# Patient Record
Sex: Female | Born: 1990 | Race: Black or African American | Hispanic: No | Marital: Single | Smoking: Former smoker
Health system: Southern US, Community
[De-identification: ages and names within clinical notes are randomized; demographics above are authoritative.]

## PROBLEM LIST (undated history)

## (undated) ENCOUNTER — Inpatient Hospital Stay (HOSPITAL_COMMUNITY): Payer: Self-pay

## (undated) DIAGNOSIS — A749 Chlamydial infection, unspecified: Secondary | ICD-10-CM

## (undated) DIAGNOSIS — A5901 Trichomonal vulvovaginitis: Secondary | ICD-10-CM

## (undated) DIAGNOSIS — O139 Gestational [pregnancy-induced] hypertension without significant proteinuria, unspecified trimester: Secondary | ICD-10-CM

## (undated) HISTORY — PX: NO PAST SURGERIES: SHX2092

---
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
## 2008-04-24 ENCOUNTER — Inpatient Hospital Stay (HOSPITAL_COMMUNITY): Admission: AD | Admit: 2008-04-24 | Discharge: 2008-04-24 | Payer: Self-pay | Admitting: Obstetrics & Gynecology

## 2008-05-26 ENCOUNTER — Ambulatory Visit (HOSPITAL_COMMUNITY): Admission: RE | Admit: 2008-05-26 | Discharge: 2008-05-26 | Payer: Self-pay | Admitting: Obstetrics

## 2008-07-18 ENCOUNTER — Inpatient Hospital Stay (HOSPITAL_COMMUNITY): Admission: AD | Admit: 2008-07-18 | Discharge: 2008-07-19 | Payer: Self-pay | Admitting: Obstetrics & Gynecology

## 2008-08-26 ENCOUNTER — Encounter: Payer: Self-pay | Admitting: Obstetrics

## 2008-08-26 ENCOUNTER — Inpatient Hospital Stay (HOSPITAL_COMMUNITY): Admission: AD | Admit: 2008-08-26 | Discharge: 2008-08-27 | Payer: Self-pay | Admitting: Obstetrics

## 2008-11-06 ENCOUNTER — Emergency Department (HOSPITAL_COMMUNITY): Admission: EM | Admit: 2008-11-06 | Discharge: 2008-11-06 | Payer: Self-pay | Admitting: Family Medicine

## 2009-01-30 ENCOUNTER — Emergency Department (HOSPITAL_COMMUNITY): Admission: EM | Admit: 2009-01-30 | Discharge: 2009-01-30 | Payer: Self-pay | Admitting: Emergency Medicine

## 2009-04-05 ENCOUNTER — Inpatient Hospital Stay (HOSPITAL_COMMUNITY): Admission: AD | Admit: 2009-04-05 | Discharge: 2009-04-05 | Payer: Self-pay | Admitting: Obstetrics and Gynecology

## 2009-06-01 ENCOUNTER — Ambulatory Visit (HOSPITAL_COMMUNITY): Admission: RE | Admit: 2009-06-01 | Discharge: 2009-06-01 | Payer: Self-pay | Admitting: Obstetrics and Gynecology

## 2009-06-02 ENCOUNTER — Ambulatory Visit: Admission: AD | Admit: 2009-06-02 | Discharge: 2009-06-02 | Payer: Self-pay | Admitting: Obstetrics and Gynecology

## 2009-06-02 ENCOUNTER — Inpatient Hospital Stay (HOSPITAL_COMMUNITY): Admission: AD | Admit: 2009-06-02 | Discharge: 2009-06-02 | Payer: Self-pay | Admitting: Obstetrics and Gynecology

## 2009-06-08 ENCOUNTER — Inpatient Hospital Stay (HOSPITAL_COMMUNITY): Admission: AD | Admit: 2009-06-08 | Discharge: 2009-06-11 | Payer: Self-pay | Admitting: Obstetrics and Gynecology

## 2009-06-11 ENCOUNTER — Encounter: Payer: Self-pay | Admitting: Obstetrics and Gynecology

## 2009-06-14 ENCOUNTER — Ambulatory Visit (HOSPITAL_COMMUNITY): Admission: RE | Admit: 2009-06-14 | Discharge: 2009-06-14 | Payer: Self-pay | Admitting: Obstetrics and Gynecology

## 2009-06-18 ENCOUNTER — Ambulatory Visit (HOSPITAL_COMMUNITY): Admission: RE | Admit: 2009-06-18 | Discharge: 2009-06-18 | Payer: Self-pay | Admitting: Obstetrics and Gynecology

## 2009-06-22 ENCOUNTER — Inpatient Hospital Stay (HOSPITAL_COMMUNITY): Admission: AD | Admit: 2009-06-22 | Discharge: 2009-06-22 | Payer: Self-pay | Admitting: Obstetrics and Gynecology

## 2009-06-23 ENCOUNTER — Ambulatory Visit (HOSPITAL_COMMUNITY): Admission: AD | Admit: 2009-06-23 | Discharge: 2009-06-23 | Payer: Self-pay | Admitting: Obstetrics and Gynecology

## 2009-06-26 ENCOUNTER — Ambulatory Visit (HOSPITAL_COMMUNITY): Admission: RE | Admit: 2009-06-26 | Discharge: 2009-06-26 | Payer: Self-pay | Admitting: Obstetrics and Gynecology

## 2009-06-29 ENCOUNTER — Encounter: Payer: Self-pay | Admitting: Obstetrics and Gynecology

## 2009-06-29 ENCOUNTER — Inpatient Hospital Stay (HOSPITAL_COMMUNITY): Admission: AD | Admit: 2009-06-29 | Discharge: 2009-07-06 | Payer: Self-pay | Admitting: Obstetrics and Gynecology

## 2009-07-02 ENCOUNTER — Encounter: Payer: Self-pay | Admitting: Obstetrics and Gynecology

## 2009-07-03 ENCOUNTER — Ambulatory Visit (HOSPITAL_COMMUNITY): Admission: RE | Admit: 2009-07-03 | Discharge: 2009-07-03 | Payer: Self-pay | Admitting: Obstetrics and Gynecology

## 2009-07-04 ENCOUNTER — Encounter: Payer: Self-pay | Admitting: Obstetrics and Gynecology

## 2009-07-05 ENCOUNTER — Encounter (INDEPENDENT_AMBULATORY_CARE_PROVIDER_SITE_OTHER): Payer: Self-pay | Admitting: Obstetrics and Gynecology

## 2009-08-03 ENCOUNTER — Encounter: Admission: RE | Admit: 2009-08-03 | Discharge: 2009-08-03 | Payer: Self-pay | Admitting: Obstetrics and Gynecology

## 2010-02-17 ENCOUNTER — Encounter: Payer: Self-pay | Admitting: Obstetrics and Gynecology

## 2010-02-17 ENCOUNTER — Encounter: Payer: Self-pay | Admitting: Family Medicine

## 2010-04-14 LAB — POCT URINALYSIS DIP (DEVICE)
Bilirubin Urine: NEGATIVE
Glucose, UA: NEGATIVE mg/dL
Hgb urine dipstick: NEGATIVE
Ketones, ur: NEGATIVE mg/dL
Protein, ur: NEGATIVE mg/dL
Urobilinogen, UA: 0.2 mg/dL (ref 0.0–1.0)

## 2010-04-14 LAB — GC/CHLAMYDIA PROBE AMP, GENITAL: Chlamydia, DNA Probe: NEGATIVE

## 2010-04-14 LAB — POCT PREGNANCY, URINE: Preg Test, Ur: POSITIVE

## 2010-04-15 LAB — CBC
HCT: 27.6 % — ABNORMAL LOW (ref 36.0–46.0)
HCT: 27.9 % — ABNORMAL LOW (ref 36.0–46.0)
HCT: 29.6 % — ABNORMAL LOW (ref 36.0–46.0)
HCT: 29.7 % — ABNORMAL LOW (ref 36.0–46.0)
Hemoglobin: 8.7 g/dL — ABNORMAL LOW (ref 12.0–15.0)
Hemoglobin: 9.2 g/dL — ABNORMAL LOW (ref 12.0–15.0)
Hemoglobin: 9.4 g/dL — ABNORMAL LOW (ref 12.0–15.0)
MCHC: 31.1 g/dL (ref 30.0–36.0)
MCHC: 31.5 g/dL (ref 30.0–36.0)
MCV: 66 fL — ABNORMAL LOW (ref 78.0–100.0)
MCV: 66.8 fL — ABNORMAL LOW (ref 78.0–100.0)
Platelets: 281 10*3/uL (ref 150–400)
Platelets: 298 10*3/uL (ref 150–400)
Platelets: 314 10*3/uL (ref 150–400)
RBC: 4.27 MIL/uL (ref 3.87–5.11)
RBC: 4.43 MIL/uL (ref 3.87–5.11)
RBC: 4.53 MIL/uL (ref 3.87–5.11)
WBC: 10.4 10*3/uL (ref 4.0–10.5)
WBC: 10.7 10*3/uL — ABNORMAL HIGH (ref 4.0–10.5)
WBC: 9.6 10*3/uL (ref 4.0–10.5)

## 2010-04-15 LAB — TYPE AND SCREEN
ABO/RH(D): O POS
Antibody Screen: POSITIVE
DAT, IgG: NEGATIVE
PT AG Type: NEGATIVE

## 2010-04-15 LAB — SYPHILIS: RPR W/REFLEX TO RPR TITER AND TREPONEMAL ANTIBODIES, TRADITIONAL SCREENING AND DIAGNOSIS ALGORITHM: RPR Ser Ql: NONREACTIVE

## 2010-04-15 LAB — RPR: RPR Ser Ql: NONREACTIVE

## 2010-04-16 LAB — URINALYSIS, ROUTINE W REFLEX MICROSCOPIC
Nitrite: NEGATIVE
Specific Gravity, Urine: 1.01 (ref 1.005–1.030)
pH: 7 (ref 5.0–8.0)

## 2010-04-16 LAB — CBC
Hemoglobin: 7.6 g/dL — ABNORMAL LOW (ref 12.0–15.0)
RBC: 3.82 MIL/uL — ABNORMAL LOW (ref 3.87–5.11)
RDW: 24.4 % — ABNORMAL HIGH (ref 11.5–15.5)

## 2010-04-21 LAB — URINALYSIS, ROUTINE W REFLEX MICROSCOPIC
Hgb urine dipstick: NEGATIVE
Specific Gravity, Urine: 1.025 (ref 1.005–1.030)
Urobilinogen, UA: 0.2 mg/dL (ref 0.0–1.0)

## 2010-05-02 LAB — WET PREP, GENITAL: Clue Cells Wet Prep HPF POC: NONE SEEN

## 2010-05-02 LAB — POCT URINALYSIS DIP (DEVICE)
Bilirubin Urine: NEGATIVE
Glucose, UA: NEGATIVE mg/dL
Nitrite: NEGATIVE

## 2010-05-02 LAB — POCT PREGNANCY, URINE: Preg Test, Ur: NEGATIVE

## 2010-05-02 LAB — GC/CHLAMYDIA PROBE AMP, GENITAL
Chlamydia, DNA Probe: NEGATIVE
GC Probe Amp, Genital: NEGATIVE

## 2010-05-04 LAB — CBC
HCT: 18.2 % — ABNORMAL LOW (ref 36.0–46.0)
Hemoglobin: 6.1 g/dL — CL (ref 12.0–15.0)
MCHC: 33.2 g/dL (ref 30.0–36.0)
MCV: 76.9 fL — ABNORMAL LOW (ref 78.0–100.0)
RDW: 17.5 % — ABNORMAL HIGH (ref 11.5–15.5)

## 2010-05-05 LAB — CBC
MCHC: 32.4 g/dL (ref 30.0–36.0)
MCV: 76.7 fL — ABNORMAL LOW (ref 78.0–100.0)
Platelets: 239 10*3/uL (ref 150–400)
RDW: 16.9 % — ABNORMAL HIGH (ref 11.5–15.5)

## 2010-05-05 LAB — PROTIME-INR: Prothrombin Time: 12.4 seconds (ref 11.6–15.2)

## 2010-05-05 LAB — FIBRINOGEN: Fibrinogen: 345 mg/dL (ref 204–475)

## 2010-05-06 LAB — URINE CULTURE

## 2010-05-06 LAB — CBC
Platelets: 260 10*3/uL (ref 150–400)
RDW: 15.1 % (ref 11.5–15.5)
WBC: 7.4 10*3/uL (ref 4.0–10.5)

## 2010-05-06 LAB — RPR: RPR Ser Ql: NONREACTIVE

## 2010-05-09 LAB — WET PREP, GENITAL
Trich, Wet Prep: NONE SEEN
Yeast Wet Prep HPF POC: NONE SEEN

## 2010-05-09 LAB — URINALYSIS, ROUTINE W REFLEX MICROSCOPIC
Bilirubin Urine: NEGATIVE
Hgb urine dipstick: NEGATIVE
Nitrite: NEGATIVE
Protein, ur: NEGATIVE mg/dL
Specific Gravity, Urine: 1.015 (ref 1.005–1.030)
Urobilinogen, UA: 0.2 mg/dL (ref 0.0–1.0)

## 2010-05-09 LAB — GC/CHLAMYDIA PROBE AMP, GENITAL
Chlamydia, DNA Probe: NEGATIVE
GC Probe Amp, Genital: NEGATIVE

## 2010-05-15 ENCOUNTER — Emergency Department (HOSPITAL_COMMUNITY)
Admission: EM | Admit: 2010-05-15 | Discharge: 2010-05-15 | Disposition: A | Discharge status: Home / Self Care | Payer: Self-pay | Attending: Emergency Medicine | Admitting: Emergency Medicine

## 2010-05-15 DIAGNOSIS — L298 Other pruritus: Secondary | ICD-10-CM | POA: Insufficient documentation

## 2010-05-15 DIAGNOSIS — R21 Rash and other nonspecific skin eruption: Secondary | ICD-10-CM | POA: Insufficient documentation

## 2010-05-15 DIAGNOSIS — L2989 Other pruritus: Secondary | ICD-10-CM | POA: Insufficient documentation

## 2010-06-11 NOTE — H&P (Signed)
NAME:  Kendra Fields, Kendra Fields              ACCOUNT NO.:  [id number]   MEDICAL RECORD NO.:  [id number]          PATIENT TYPE:  INP   LOCATION:  9161                          FACILITY:  [facility]   PHYSICIAN:  Roseanna Rainbow, M.D.DATE OF BIRTH:  11-Feb-1990   DATE OF ADMISSION:  10/27/2007  DATE OF DISCHARGE:                              HISTORY & PHYSICAL   CHIEF COMPLAINT:  The patient is a 20 year old para 0 with a fetal  demise at 32 weeks and elevated blood pressures.   HISTORY OF PRESENT ILLNESS:  The patient presented to the office earlier  today and was found to have blood pressures in the 140s/110 range and 3+  proteinuria on urine dip.  The fetal heart rate was unable to be  obtained with the Doppler.  An ultrasound in the Radiology Suite at  [facility] subsequently confirmed a fetal demise at 32 plus weeks.  The patient denies any neurological symptoms at present.   SOCIAL HISTORY:  She is single.  She denies any tobacco, ethanol, or  drug use.   PAST GYN HISTORY:  There is a history of chlamydia.   PAST SURGICAL HISTORY:  She denies.   FAMILY HISTORY:  Noncontributory.   PHYSICAL EXAMINATION:  VITAL SIGNS:  Blood pressures initially  180s/110s.  After 20 mg IV labetalol push, blood pressure is 140s/90s.  GENERAL:  Thin African American female, flat, affect appropriate.  PELVIC:  Sterile vaginal exam of the cervix is 1 cm dilated, thick.  The  membranes were palpable.  An attempt was made to rupture; however, this  was unsuccessful.   LABORATORY VALUES:  PIH panel essentially normal, normal platelets, LDH,  creatinine, and LFTs.   ASSESSMENT:  Fetal demise at 32 plus weeks likely in the setting of  pregnancy-induced hypertension.  Labile blood pressures.   PLAN:  Admission, magnesium sulfate for seizure prophylaxis with strict  IV fluids.  Labetalol parenterally in labor as needed, epidural now,  anxiolytics as a standing order until delivery, and we will also  obtain  a DIC panel.      Roseanna Rainbow, M.D.  Electronically Signed     LAJ/MEDQ  D:  10/27/2007  T:  10/28/2007  Job:  [id number]

## 2010-10-21 LAB — POCT PREGNANCY, URINE: Preg Test, Ur: POSITIVE

## 2010-10-28 LAB — URINE MICROSCOPIC-ADD ON

## 2010-10-28 LAB — URINALYSIS, ROUTINE W REFLEX MICROSCOPIC
Glucose, UA: NEGATIVE
Leukocytes, UA: NEGATIVE
Nitrite: NEGATIVE
Protein, ur: 100 — AB
pH: 7

## 2010-10-28 LAB — COMPREHENSIVE METABOLIC PANEL
ALT: 11
AST: 20
Calcium: 9
GFR calc Af Amer: >60
Sodium: 135
Total Protein: 6.6

## 2010-10-28 LAB — KLEIHAUER-BETKE STAIN
Fetal Cells %: 0
Quantitation Fetal Hemoglobin: 0

## 2010-10-28 LAB — DIC (DISSEMINATED INTRAVASCULAR COAGULATION)PANEL
D-Dimer, Quant: 2.58 — ABNORMAL HIGH
Fibrinogen: 490 — ABNORMAL HIGH
Smear Review: NONE SEEN

## 2010-10-28 LAB — CBC
MCHC: 32
Platelets: 306
RDW: 15.3

## 2010-10-29 LAB — RAPID URINE DRUG SCREEN, HOSP PERFORMED: Cocaine: NOT DETECTED

## 2010-10-30 ENCOUNTER — Inpatient Hospital Stay (HOSPITAL_COMMUNITY)
Admission: AD | Admit: 2010-10-30 | Discharge: 2010-10-30 | Disposition: A | Discharge status: Home / Self Care | Payer: Self-pay | Source: Ambulatory Visit | Attending: Obstetrics & Gynecology | Admitting: Obstetrics & Gynecology

## 2010-10-30 ENCOUNTER — Inpatient Hospital Stay (HOSPITAL_COMMUNITY): Payer: Self-pay

## 2010-10-30 ENCOUNTER — Encounter (HOSPITAL_COMMUNITY): Payer: Self-pay

## 2010-10-30 DIAGNOSIS — R102 Pelvic and perineal pain: Secondary | ICD-10-CM

## 2010-10-30 DIAGNOSIS — R1032 Left lower quadrant pain: Secondary | ICD-10-CM

## 2010-10-30 DIAGNOSIS — N949 Unspecified condition associated with female genital organs and menstrual cycle: Secondary | ICD-10-CM

## 2010-10-30 HISTORY — DX: Gestational (pregnancy-induced) hypertension without significant proteinuria, unspecified trimester: O13.9

## 2010-10-30 LAB — DIFFERENTIAL
Eosinophils Relative: 1 % (ref 0–5)
Lymphocytes Relative: 57 % — ABNORMAL HIGH (ref 12–46)
Lymphs Abs: 2.3 10*3/uL (ref 0.7–4.0)
Monocytes Absolute: 0.3 10*3/uL (ref 0.1–1.0)
Neutro Abs: 1.4 10*3/uL — ABNORMAL LOW (ref 1.7–7.7)

## 2010-10-30 LAB — POCT PREGNANCY, URINE: Preg Test, Ur: NEGATIVE

## 2010-10-30 LAB — CBC
HCT: 35.6 % — ABNORMAL LOW (ref 36.0–46.0)
Hemoglobin: 11.7 g/dL — ABNORMAL LOW (ref 12.0–15.0)
MCV: 80.4 fL (ref 78.0–100.0)
RBC: 4.43 MIL/uL (ref 3.87–5.11)
WBC: 4.1 10*3/uL (ref 4.0–10.5)

## 2010-10-30 LAB — URINALYSIS, ROUTINE W REFLEX MICROSCOPIC
Glucose, UA: NEGATIVE mg/dL
Ketones, ur: NEGATIVE mg/dL
Leukocytes, UA: NEGATIVE
Specific Gravity, Urine: 1.01 (ref 1.005–1.030)
pH: 7 (ref 5.0–8.0)

## 2010-10-30 LAB — URINE MICROSCOPIC-ADD ON

## 2010-10-30 LAB — WET PREP, GENITAL

## 2010-10-30 MED ORDER — KETOROLAC TROMETHAMINE 60 MG/2ML IM SOLN
60.0000 mg | Freq: Once | INTRAMUSCULAR | Status: AC
  Administered 2010-10-30: 60 mg via INTRAMUSCULAR
  Filled 2010-10-30: qty 2

## 2010-10-30 MED ORDER — NAPROXEN SODIUM 550 MG PO TABS: 550.0000 mg | ORAL_TABLET | Freq: Two times a day (BID) | ORAL | Status: DC

## 2010-10-30 NOTE — ED Provider Notes (Signed)
History     CSN: [id number] Arrival date & time: 10/30/2010 10:25 AM   HPI Kendra Fields is a 20 y.o. female who presents to [facility] for low abdominal pain that started a week ago. The pain is more on the left side. Some constipation, no n/v. Marina for birth control, no history of STD's. Current sex partner x 4 years. Last pap smear 1 year ago and was normal.   Past Medical History  Diagnosis Date  . Preterm labor   . Pregnancy induced hypertension     Past Surgical History  Procedure Date  . No past surgeries     Family History  Problem Relation Age of Onset  . Alcohol abuse Mother   . Asthma Brother   . Stroke Maternal Uncle   . Cancer Maternal Grandmother     brain  . Diabetes Maternal Grandmother   . Diabetes Paternal Grandmother   . Hypertension Paternal Grandmother     History  Substance Use Topics  . Smoking status: Never Smoker   . Smokeless tobacco: Not on file  . Alcohol Use: Yes     occassionally    OB History    Grav Para Term Preterm Abortions TAB SAB Ect Mult Living   3 3 0 3  0 0 0 1 2      Review of Systems  Constitutional: Negative for fever, chills, diaphoresis and fatigue.  HENT: Negative for ear pain, congestion, sore throat, facial swelling, neck pain, neck stiffness, dental problem and sinus pressure.   Eyes: Negative for photophobia, pain and discharge.  Respiratory: Negative for cough, chest tightness and wheezing.   Cardiovascular: Negative.   Gastrointestinal: Positive for abdominal pain and constipation. Negative for nausea, vomiting and diarrhea.  Genitourinary: Positive for frequency. Negative for dysuria, flank pain, decreased urine volume, vaginal bleeding, vaginal discharge and difficulty urinating.  Musculoskeletal: Negative for myalgias, back pain and gait problem.  Skin: Negative for color change and rash.  Neurological: Positive for headaches. Negative for dizziness, speech difficulty, weakness, light-headedness and numbness.    Psychiatric/Behavioral: Negative for confusion and agitation. The patient is not nervous/anxious.     Allergies  Penicillins  Home Medications  No current outpatient prescriptions on file.  BP 123/83  Pulse 78  Temp(Src) 98.6 F (37 C) (Oral)  Resp 20  Ht 5\' 4"  (1.626 m)  Wt 133 lb 4 oz (60.442 kg)  BMI 22.87 kg/m2  Physical Exam  Nursing note and vitals reviewed. Constitutional: She is oriented to person, place, and time. She appears well-developed and well-nourished. No distress.  HENT:  Head: Normocephalic.  Eyes: EOM are normal.  Neck: Neck supple.  Pulmonary/Chest: Effort normal.  Abdominal: Soft. There is tenderness in the left lower quadrant. There is no rebound and no guarding.  Genitourinary:       External genitalia pink, moist, without lesions. Cervix, IUD string is visible, small amount of dark blood vaginal vault. No adnexal mass palpable, uterus not enlarged. Mildly tender left adnexa. Rectal exam - normal tone, no stool palpable.  Musculoskeletal: Normal range of motion.  Neurological: She is alert and oriented to person, place, and time. No cranial nerve deficit.  Skin: Skin is warm and dry.  US Transvaginal Non-ob  10/30/2010  *RADIOLOGY REPORT*  Clinical Data: Left-sided pelvic pain.  IUD.  TRANSABDOMINAL AND TRANSVAGINAL ULTRASOUND OF PELVIS  Technique:  Both transabdominal and transvaginal ultrasound examinations of the pelvis were performed.  Transabdominal technique was performed for global imaging of the pelvis  including uterus, ovaries, adnexal regions, and pelvic cul-de-sac.  It was necessary to proceed with endovaginal exam following the transabdominal exam to visualize the IUD location and ovaries.  Comparison:  None.  Findings: Uterus:  Normal in size and appearance  Endometrium: IUD is seen in normal position in the endometrial cavity, which is confirmed by 3-D volume imaging.  Double layer thickness measures 3 mm.  Right ovary: Normal appearance/no  adnexal mass  Left ovary: Normal appearance/no adnexal mass  Other Findings:  Tiny amount of free fluid in the pelvic cul-de-sac is likely physiologic.  IMPRESSION:  1.  Normal uterus, with normal position of IUD in endometrial cavity. 2.  Normal ovaries.  No adnexal mass identified. 3.  Small amount of free fluid in the cul-de-sac, which is nonspecific but likely physiologic.  Original Report Authenticated By: Danae Orleans, M.D.   US Pelvis Complete  10/30/2010  *RADIOLOGY REPORT*  Clinical Data: Left-sided pelvic pain.  IUD.  TRANSABDOMINAL AND TRANSVAGINAL ULTRASOUND OF PELVIS  Technique:  Both transabdominal and transvaginal ultrasound examinations of the pelvis were performed.  Transabdominal technique was performed for global imaging of the pelvis including uterus, ovaries, adnexal regions, and pelvic cul-de-sac.  It was necessary to proceed with endovaginal exam following the transabdominal exam to visualize the IUD location and ovaries.  Comparison:  None.  Findings: Uterus:  Normal in size and appearance  Endometrium: IUD is seen in normal position in the endometrial cavity, which is confirmed by 3-D volume imaging.  Double layer thickness measures 3 mm.  Right ovary: Normal appearance/no adnexal mass  Left ovary: Normal appearance/no adnexal mass  Other Findings:  Tiny amount of free fluid in the pelvic cul-de-sac is likely physiologic.  IMPRESSION:  1.  Normal uterus, with normal position of IUD in endometrial cavity. 2.  Normal ovaries.  No adnexal mass identified. 3.  Small amount of free fluid in the cul-de-sac, which is nonspecific but likely physiologic.  Original Report Authenticated By: Danae Orleans, M.D.   Results for orders placed during the hospital encounter of 10/30/10 (from the past 24 hour(s))  URINALYSIS, ROUTINE W REFLEX MICROSCOPIC     Status: Abnormal   Collection Time   10/30/10 11:10 AM      Component Value Range   Color, Urine YELLOW  YELLOW    Appearance CLEAR  CLEAR     Specific Gravity, Urine 1.010  1.005 - 1.030    pH 7.0  5.0 - 8.0    Glucose, UA NEGATIVE  NEGATIVE (mg/dL)   Hgb urine dipstick TRACE (*) NEGATIVE    Bilirubin Urine NEGATIVE  NEGATIVE    Ketones, ur NEGATIVE  NEGATIVE (mg/dL)   Protein, ur NEGATIVE  NEGATIVE (mg/dL)   Urobilinogen, UA 0.2  0.0 - 1.0 (mg/dL)   Nitrite NEGATIVE  NEGATIVE    Leukocytes, UA NEGATIVE  NEGATIVE   URINE MICROSCOPIC-ADD ON     Status: Normal   Collection Time   10/30/10 11:10 AM      Component Value Range   Squamous Epithelial / LPF RARE  RARE    WBC, UA 0-2  <3 (WBC/hpf)   RBC / HPF 0-2  <3 (RBC/hpf)  POCT PREGNANCY, URINE     Status: Normal   Collection Time   10/30/10 11:19 AM      Component Value Range   Preg Test, Ur NEGATIVE    WET PREP, GENITAL     Status: Abnormal   Collection Time   10/30/10 11:51 AM  Component Value Range   Yeast, Wet Prep NONE SEEN  NONE SEEN    Trich, Wet Prep NONE SEEN  NONE SEEN    Clue Cells, Wet Prep FEW (*) NONE SEEN    WBC, Wet Prep HPF POC FEW (*) NONE SEEN   CBC     Status: Abnormal   Collection Time   10/30/10 11:58 AM      Component Value Range   WBC 4.1  4.0 - 10.5 (K/uL)   RBC 4.43  3.87 - 5.11 (MIL/uL)   Hemoglobin 11.7 (*) 12.0 - 15.0 (g/dL)   HCT 16.1 (*) 09.6 - 46.0 (%)   MCV 80.4  78.0 - 100.0 (fL)   MCH 26.4  26.0 - 34.0 (pg)   MCHC 32.9  30.0 - 36.0 (g/dL)   RDW 04.5  40.9 - 81.1 (%)   Platelets 264  150 - 400 (K/uL)  DIFFERENTIAL     Status: Abnormal   Collection Time   10/30/10 11:58 AM      Component Value Range   Neutrophils Relative 35 (*) 43 - 77 (%)   Neutro Abs 1.4 (*) 1.7 - 7.7 (K/uL)   Lymphocytes Relative 57 (*) 12 - 46 (%)   Lymphs Abs 2.3  0.7 - 4.0 (K/uL)   Monocytes Relative 7  3 - 12 (%)   Monocytes Absolute 0.3  0.1 - 1.0 (K/uL)   Eosinophils Relative 1  0 - 5 (%)   Eosinophils Absolute 0.1  0.0 - 0.7 (K/uL)   Basophils Relative 1  0 - 1 (%)   Basophils Absolute 0.0  0.0 - 0.1 (K/uL)   Assessment: Pelvic  pain  Plan:  Discussed results of lab and ultrasound with patient.   Toradol 60 mg. IM x 1   Rx:Anaprox DS   Follow up in [facility]  ED Course  Procedures            [facility], NP 10/30/10 1352

## 2010-10-30 NOTE — Progress Notes (Signed)
Pt states, " I've had low abdominal pain for about a week, but it has been worse since last night, and mostly on my left side."

## 2011-04-19 ENCOUNTER — Emergency Department (INDEPENDENT_AMBULATORY_CARE_PROVIDER_SITE_OTHER)
Admission: EM | Admit: 2011-04-19 | Discharge: 2011-04-19 | Disposition: A | Discharge status: Home / Self Care | Payer: Self-pay | Source: Home / Self Care | Attending: Emergency Medicine | Admitting: Emergency Medicine

## 2011-04-19 ENCOUNTER — Encounter (HOSPITAL_COMMUNITY): Payer: Self-pay

## 2011-04-19 DIAGNOSIS — Z789 Other specified health status: Secondary | ICD-10-CM

## 2011-04-19 DIAGNOSIS — IMO0001 Reserved for inherently not codable concepts without codable children: Secondary | ICD-10-CM

## 2011-04-19 DIAGNOSIS — N760 Acute vaginitis: Secondary | ICD-10-CM

## 2011-04-19 LAB — POCT PREGNANCY, URINE: Preg Test, Ur: NEGATIVE

## 2011-04-19 LAB — POCT URINALYSIS DIP (DEVICE)
Ketones, ur: NEGATIVE mg/dL
Leukocytes, UA: NEGATIVE
Nitrite: NEGATIVE
Protein, ur: NEGATIVE mg/dL
Urobilinogen, UA: 1 mg/dL (ref 0.0–1.0)
pH: 5.5 (ref 5.0–8.0)

## 2011-04-19 LAB — WET PREP, GENITAL

## 2011-04-19 MED ORDER — FLUCONAZOLE 200 MG PO TABS: 200.0000 mg | ORAL_TABLET | Freq: Every day | ORAL | Status: DC

## 2011-04-19 NOTE — Discharge Instructions (Signed)
We will contact you ONLY if further treatment necessary as results become available

## 2011-04-19 NOTE — ED Provider Notes (Signed)
History     CSN: [id number]  Arrival date & time 04/19/11  1144   First MD Initiated Contact with Patient 04/19/11 1204      Chief Complaint  Patient presents with  . Vaginitis    (Consider location/radiation/quality/duration/timing/severity/associated sxs/prior treatment) HPI Comments: Patient presents urgent care complaining about a week ago has been experiencing burning, itching and discharge. She did try for 1 day Monistat but that seemed not to help her with her symptoms. She had started with her expected. Yesterday and today it's more abundant.  Patient denies any pelvic pain, does not seem to be concerned about STD says it has same partner for 5 years. Denies any fevers, or gastrointestinal symptoms  Patient is a 21 y.o. female presenting with vaginal discharge. The history is provided by the patient.  Vaginal Discharge This is a new problem. The current episode started more than 1 week ago. The problem occurs constantly. The problem has not changed since onset.Pertinent negatives include no abdominal pain. Treatments tried: OTC CREAM?    Past Medical History  Diagnosis Date  . Preterm labor   . Pregnancy induced hypertension     Past Surgical History  Procedure Date  . No past surgeries     Family History  Problem Relation Age of Onset  . Alcohol abuse Mother   . Asthma Brother   . Stroke Maternal Uncle   . Cancer Maternal Grandmother     brain  . Diabetes Maternal Grandmother   . Diabetes Paternal Grandmother   . Hypertension Paternal Grandmother     History  Substance Use Topics  . Smoking status: Never Smoker   . Smokeless tobacco: Not on file  . Alcohol Use: Yes     occassionally    OB History    Grav Para Term Preterm Abortions TAB SAB Ect Mult Living   3 3 0 3  0 0 0 1 2      Review of Systems  Constitutional: Negative for fever, chills and fatigue.  Gastrointestinal: Negative for abdominal pain.  Genitourinary: Positive for vaginal  bleeding and vaginal discharge. Negative for dysuria, frequency and flank pain.    Allergies  Penicillins  Home Medications   Current Outpatient Rx  Name Route Sig Dispense Refill  . ACETAMINOPHEN 500 MG PO TABS Oral Take 1,000 mg by mouth as needed. For pain      . FLUCONAZOLE 200 MG PO TABS Oral Take 1 tablet (200 mg total) by mouth daily. 1 tablet 0  . LEVONORGESTREL 20 MCG/24HR IU IUD Intrauterine 1 each by Intrauterine route once. Pt states that she had her Mirena placed 10/2009     . NAPROXEN SODIUM 550 MG PO TABS Oral Take 1 tablet (550 mg total) by mouth 2 (two) times daily with a meal. 15 tablet 0    BP 111/58  Pulse 67  Temp(Src) 98.8 F (37.1 C) (Oral)  Resp 18  SpO2 100%  Physical Exam  Nursing note and vitals reviewed. Constitutional: She appears well-developed and well-nourished.  Eyes: Conjunctivae are normal.  Abdominal: Soft. There is no tenderness. There is no rebound and no guarding.  Genitourinary: Guaiac negative stool. There is no rash, tenderness or lesion on the right labia. There is no rash, tenderness or lesion on the left labia. There is bleeding around the vagina. No foreign body around the vagina. No signs of injury around the vagina. No vaginal discharge found.    ED Course  Procedures (including critical care time)  Labs  Reviewed  POCT URINALYSIS DIP (DEVICE) - Abnormal; Notable for the following:    Hgb urine dipstick LARGE (*)    All other components within normal limits  POCT PREGNANCY, URINE  WET PREP, GENITAL  GC/CHLAMYDIA PROBE AMP, GENITAL   No results found.   1. Vaginitis   2. Onset of menses       MDM  Limited exam is unable visualize character of discharge as patient was actively with menses, samples obtained for wet prep and STD screening        Jimmie Molly, MD 04/19/11 1252

## 2011-04-19 NOTE — ED Notes (Signed)
Patient states that symptoms started a week ago. Itching, burning, discharge (white cheesy). Tried one day Monistat when symptoms first started a week ago. Patient states that it started to get better but symptoms did not go away.

## 2011-04-20 ENCOUNTER — Encounter (HOSPITAL_COMMUNITY): Payer: Self-pay

## 2011-04-20 ENCOUNTER — Emergency Department (HOSPITAL_COMMUNITY)
Admission: EM | Admit: 2011-04-20 | Discharge: 2011-04-20 | Disposition: A | Discharge status: Home / Self Care | Payer: Self-pay | Attending: Emergency Medicine | Admitting: Emergency Medicine

## 2011-04-20 DIAGNOSIS — H109 Unspecified conjunctivitis: Secondary | ICD-10-CM | POA: Insufficient documentation

## 2011-04-20 DIAGNOSIS — Z880 Allergy status to penicillin: Secondary | ICD-10-CM | POA: Insufficient documentation

## 2011-04-20 MED ORDER — POLYMYXIN B-TRIMETHOPRIM 10000-0.1 UNIT/ML-% OP SOLN: OPHTHALMIC | Status: DC

## 2011-04-20 NOTE — Discharge Instructions (Signed)
Conjunctivitis Conjunctivitis is commonly called "pink eye." Conjunctivitis can be caused by bacterial or viral infection, allergies, or injuries. There is usually redness of the lining of the eye, itching, discomfort, and sometimes discharge. There may be deposits of matter along the eyelids. A viral infection usually causes a watery discharge, while a bacterial infection causes a yellowish, thick discharge. Pink eye is very contagious and spreads by direct contact. You may be given antibiotic eyedrops as part of your treatment. Before using your eye medicine, remove all drainage from the eye by washing gently with warm water and cotton balls. Continue to use the medication until you have awakened 2 mornings in a row without discharge from the eye. Do not rub your eye. This increases the irritation and helps spread infection. Use separate towels from other household members. Wash your hands with soap and water before and after touching your eyes. Use cold compresses to reduce pain and sunglasses to relieve irritation from light. Do not wear contact lenses or wear eye makeup until the infection is gone. SEEK MEDICAL CARE IF:   Your symptoms are not better after 3 days of treatment.   You have increased pain or trouble seeing.   The outer eyelids become very red or swollen.  Document Released: 02/21/2004 Document Revised: 01/02/2011 Document Reviewed: 01/13/2005 [employer] Patient Information 2012 [employer]. 

## 2011-04-20 NOTE — ED Provider Notes (Signed)
History     CSN: [id number]  Arrival date & time 04/20/11  1454   First MD Initiated Contact with Patient 04/20/11 1516      Chief Complaint  Patient presents with  . Conjunctivitis    (Consider location/radiation/quality/duration/timing/severity/associated sxs/prior treatment) HPI Comments: 21 year old female who presents for right eye redness. Mother reports sensitivity to light, and some drainage noted. No change in vision. Normal extraocular movements. No pain with eye movement. Mild rhinorrhea, no fevers. Minimal URI symptoms.    Patient is a 21 y.o. female presenting with conjunctivitis. The history is provided by the patient. No language interpreter was used.  Conjunctivitis  The current episode started today. The onset was sudden. The problem occurs continuously. The problem has been unchanged. The problem is mild. The symptoms are relieved by nothing. The symptoms are aggravated by light. Associated symptoms include photophobia and eye redness. Pertinent negatives include no decreased vision, no double vision, no eye itching, no ear discharge, no ear pain, no headaches, no mouth sores, no stridor and no eye pain.    Past Medical History  Diagnosis Date  . Preterm labor   . Pregnancy induced hypertension     Past Surgical History  Procedure Date  . No past surgeries     Family History  Problem Relation Age of Onset  . Alcohol abuse Mother   . Asthma Brother   . Stroke Maternal Uncle   . Cancer Maternal Grandmother     brain  . Diabetes Maternal Grandmother   . Diabetes Paternal Grandmother   . Hypertension Paternal Grandmother     History  Substance Use Topics  . Smoking status: Never Smoker   . Smokeless tobacco: Not on file  . Alcohol Use: Yes     occassionally    OB History    Grav Para Term Preterm Abortions TAB SAB Ect Mult Living   3 3 0 3  0 0 0 1 2      Review of Systems  HENT: Negative for ear pain, mouth sores and ear discharge.   Eyes:  Positive for photophobia and redness. Negative for double vision, pain and itching.  Respiratory: Negative for stridor.   Neurological: Negative for headaches.  All other systems reviewed and are negative.    Allergies  Penicillins  Home Medications   Current Outpatient Rx  Name Route Sig Dispense Refill  . LEVONORGESTREL 20 MCG/24HR IU IUD Intrauterine 1 each by Intrauterine route once. Pt states that she had her Mirena placed 10/2009     . POLYMYXIN B-TRIMETHOPRIM 10000-0.1 UNIT/ML-% OP SOLN  1 drop to right eye q 4 hours while awake x 7 days 10 mL 0    BP 126/79  Pulse 101  Temp(Src) 98.1 F (36.7 C) (Oral)  Resp 18  Wt 137 lb (62.143 kg)  SpO2 100%  Physical Exam  Vitals reviewed. Constitutional: She appears well-developed and well-nourished.  HENT:  Right Ear: External ear normal.  Left Ear: External ear normal.  Eyes: EOM are normal. Pupils are equal, round, and reactive to light. Right eye exhibits discharge.       Right eye with scleral injection and drainage.  Neck: Normal range of motion. Neck supple.  Cardiovascular: Normal rate, normal heart sounds and intact distal pulses.   Pulmonary/Chest: Effort normal.  Abdominal: Soft. Bowel sounds are normal.  Musculoskeletal: Normal range of motion.  Neurological: She is alert.  Skin: Skin is warm and dry.    ED Course  Procedures (including critical  care time)  Labs Reviewed - No data to display No results found.   1. Conjunctivitis       MDM  Patient is a 21 year old with conjunctivitis. We'll start on Polytrim drops. Discussed signs to warrant reevaluation.        Chrystine Oiler, MD 04/20/11 [phone]

## 2011-04-20 NOTE — ED Notes (Signed)
?   Pink eye onset yesterday.  Pt reports pain and drainage.

## 2011-04-21 LAB — GC/CHLAMYDIA PROBE AMP, GENITAL: Chlamydia, DNA Probe: NEGATIVE

## 2011-10-10 IMAGING — US US FETAL BPP W/O NONSTRESS
1 series · 18 of 28 positions shown · non-contrast
Comparison: none

OBSTETRICAL ULTRASOUND:
 This ultrasound was performed in The [HOSPITAL], and the [facility] report will be stored to [REDACTED] PACS.  This report is also available in [HOSPITAL]?s accessANYware.

[Series 1: us fetal bpp w/o nonstress · 37 acquisitions, 18 frames shown]
[im 1/37]
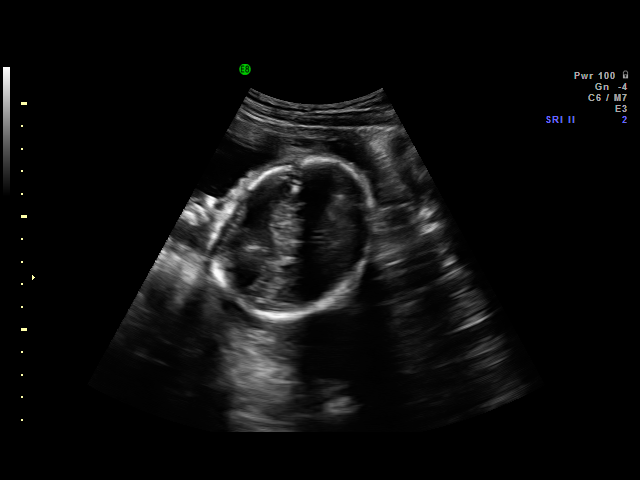
[im 3/37]
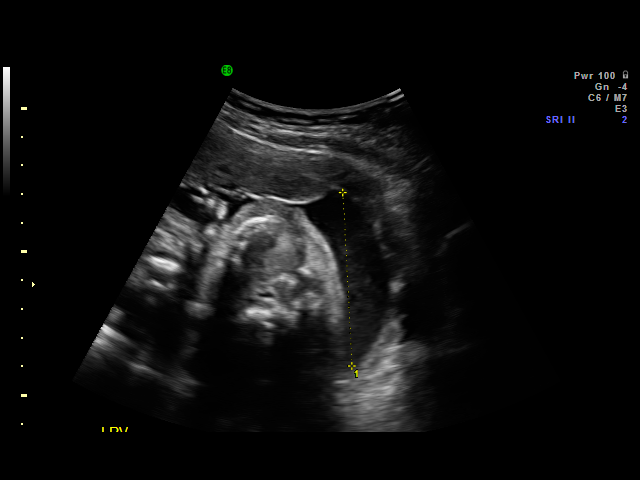
[im 5/37]
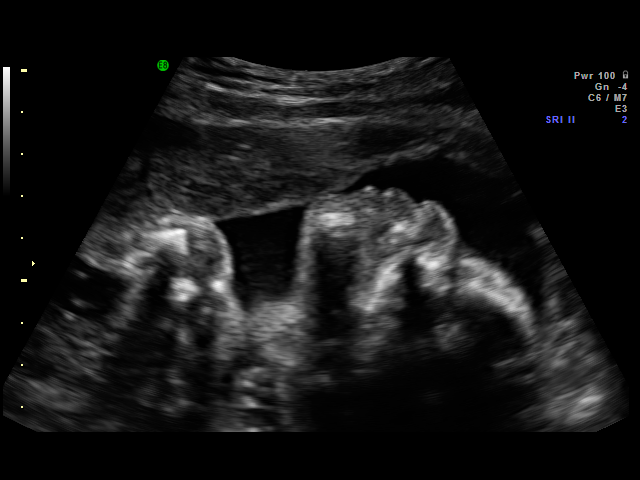
[im 7/37]
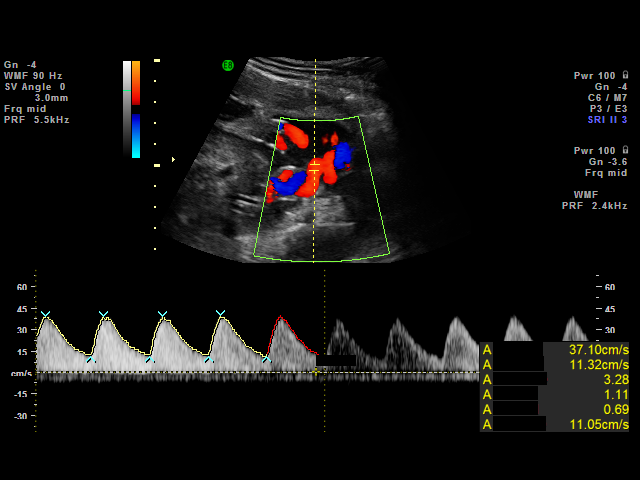
[im 10/37]
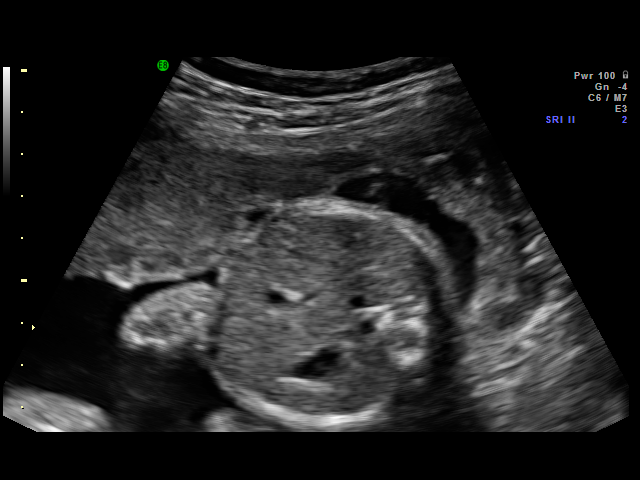
[im 11/37]
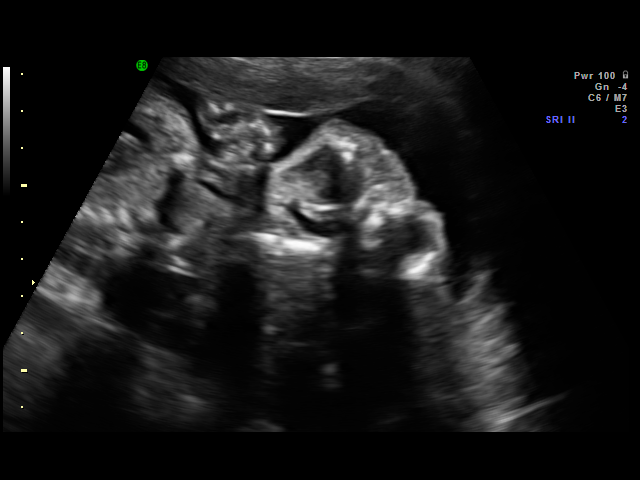
[im 14/37]
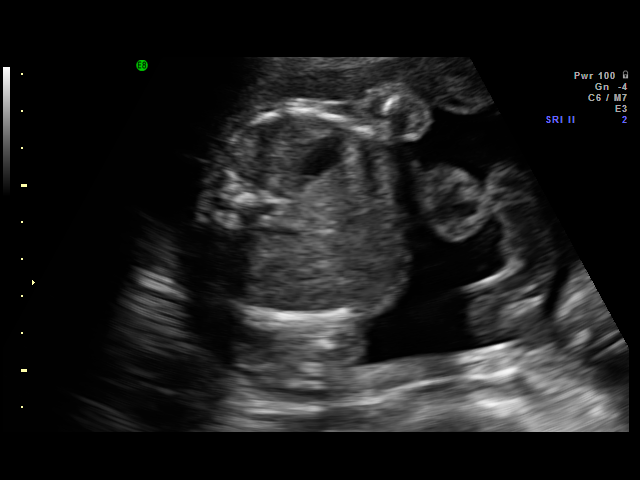
[im 15/37]
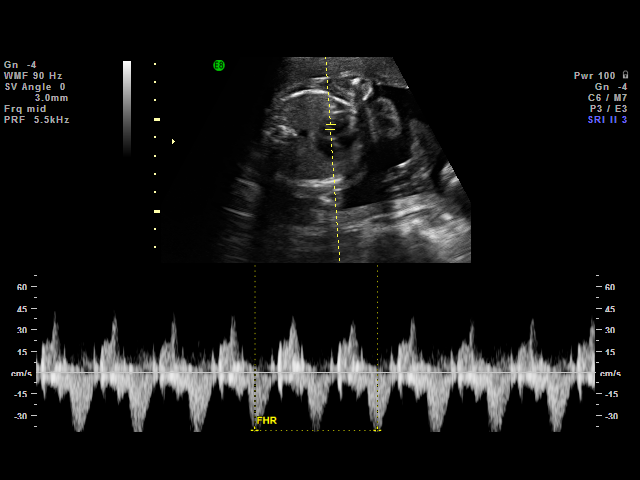
[im 18/37]
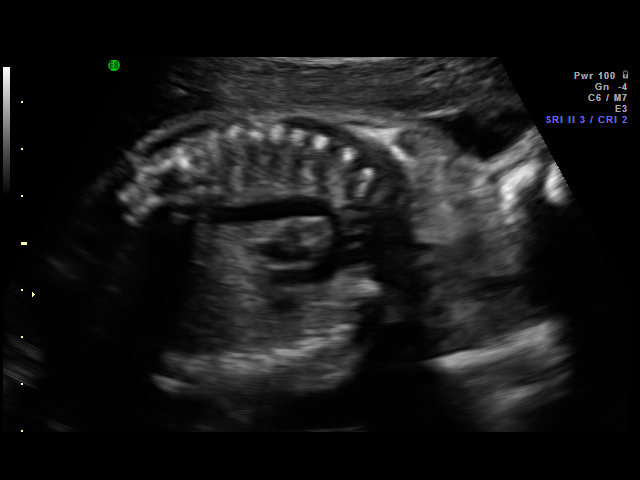
[im 19/37]
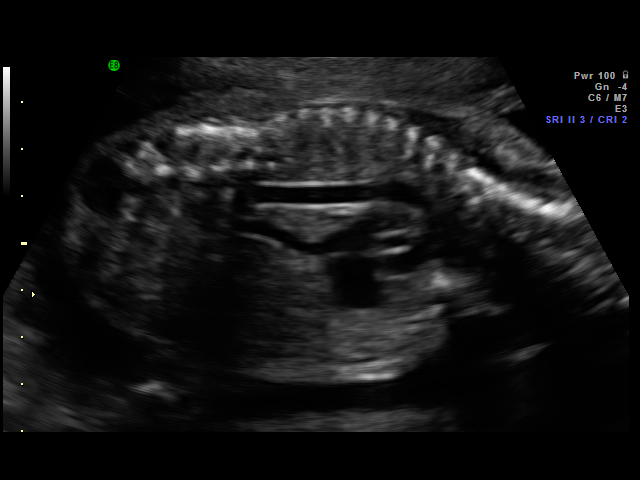
[im 22/37]
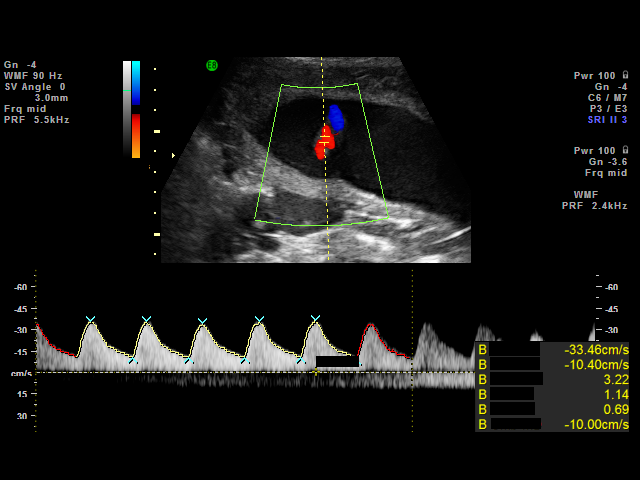
[im 23/37]
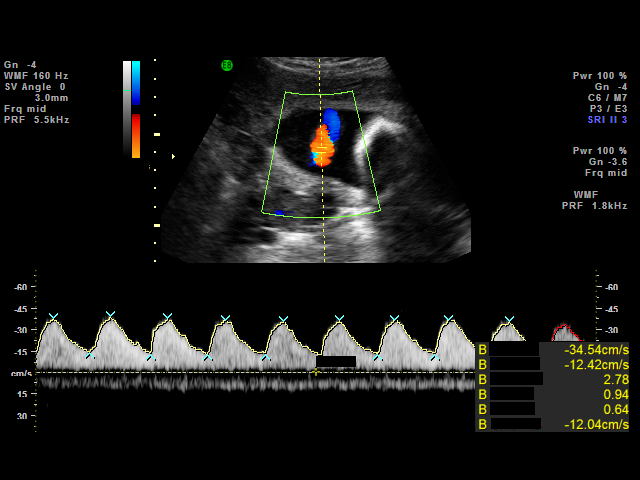
[im 26/37]
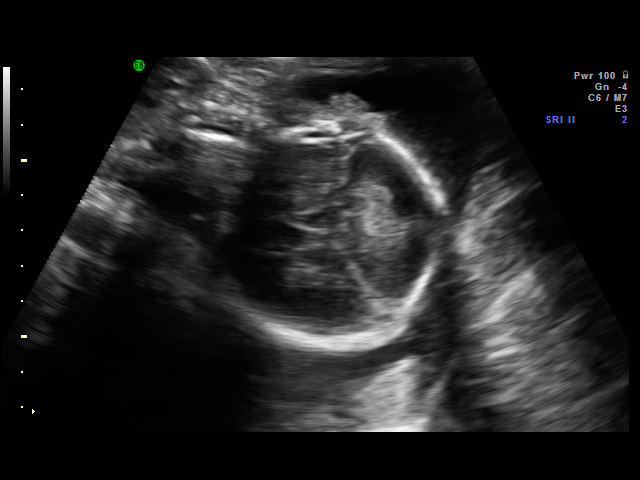
[im 29/37]
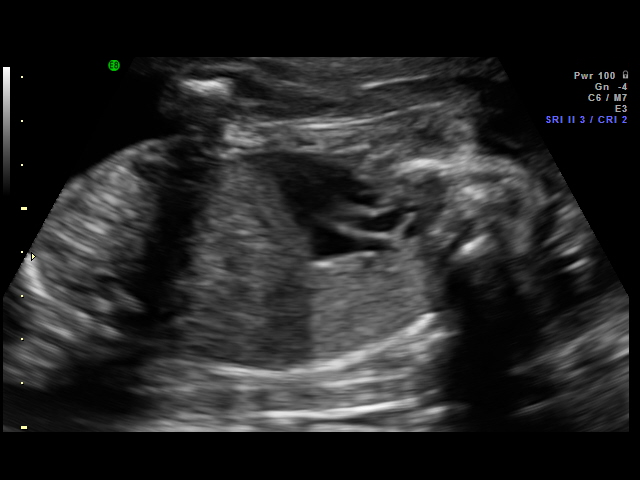
[im 30/37]
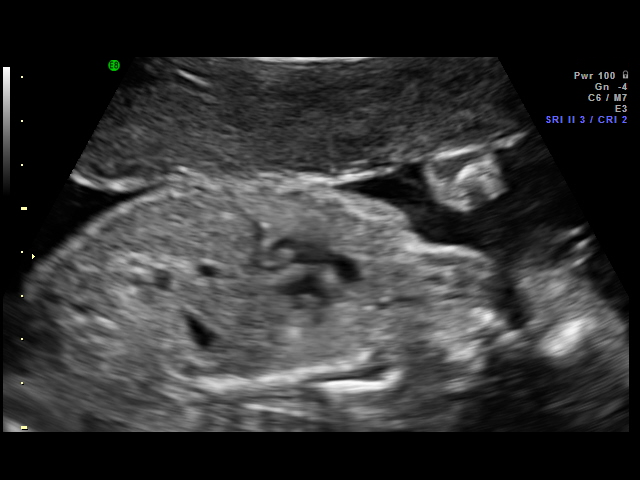
[im 33/37]
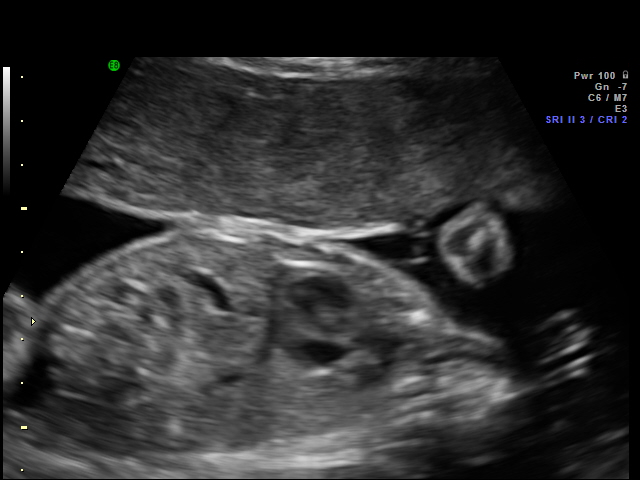
[im 34/37]
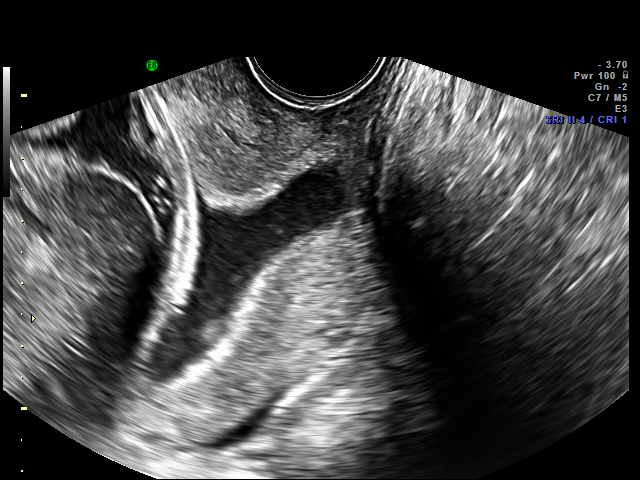
[im 37/37]
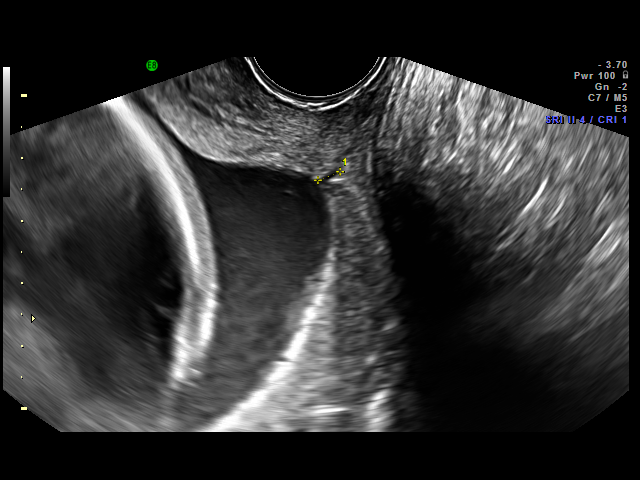

[18 of 28 positions shown; findings below may reference images not displayed]

IMPRESSION: [facility] has also been faxed to the ordering physician.

## 2012-01-28 DIAGNOSIS — IMO0001 Reserved for inherently not codable concepts without codable children: Secondary | ICD-10-CM

## 2012-04-29 ENCOUNTER — Emergency Department (INDEPENDENT_AMBULATORY_CARE_PROVIDER_SITE_OTHER)
Admission: EM | Admit: 2012-04-29 | Discharge: 2012-04-29 | Disposition: A | Discharge status: Home / Self Care | Payer: Self-pay | Source: Home / Self Care | Attending: Family Medicine | Admitting: Family Medicine

## 2012-04-29 ENCOUNTER — Other Ambulatory Visit (HOSPITAL_COMMUNITY)
Admission: RE | Admit: 2012-04-29 | Discharge: 2012-04-29 | Disposition: A | Discharge status: Home / Self Care | Payer: Self-pay | Source: Ambulatory Visit | Attending: Family Medicine | Admitting: Family Medicine

## 2012-04-29 ENCOUNTER — Encounter (HOSPITAL_COMMUNITY): Payer: Self-pay | Admitting: *Deleted

## 2012-04-29 DIAGNOSIS — R1032 Left lower quadrant pain: Secondary | ICD-10-CM

## 2012-04-29 DIAGNOSIS — K5900 Constipation, unspecified: Secondary | ICD-10-CM

## 2012-04-29 DIAGNOSIS — Z113 Encounter for screening for infections with a predominantly sexual mode of transmission: Secondary | ICD-10-CM | POA: Insufficient documentation

## 2012-04-29 DIAGNOSIS — N760 Acute vaginitis: Secondary | ICD-10-CM | POA: Insufficient documentation

## 2012-04-29 LAB — POCT URINALYSIS DIP (DEVICE)
Bilirubin Urine: NEGATIVE
Glucose, UA: NEGATIVE mg/dL
Nitrite: NEGATIVE
Specific Gravity, Urine: 1.02 (ref 1.005–1.030)
Urobilinogen, UA: 2 mg/dL — ABNORMAL HIGH (ref 0.0–1.0)

## 2012-04-29 LAB — POCT PREGNANCY, URINE: Preg Test, Ur: NEGATIVE

## 2012-04-29 MED ORDER — CIPROFLOXACIN HCL 500 MG PO TABS: 500.0000 mg | ORAL_TABLET | Freq: Two times a day (BID) | ORAL | Status: DC

## 2012-04-29 MED ORDER — POLYETHYLENE GLYCOL 3350 17 G PO PACK: 17.0000 g | PACK | Freq: Every day | ORAL | Status: DC

## 2012-04-29 MED ORDER — TRAMADOL HCL 50 MG PO TABS: 50.0000 mg | ORAL_TABLET | Freq: Four times a day (QID) | ORAL | Status: DC | PRN

## 2012-04-29 NOTE — ED Notes (Signed)
pT  REPORTS  SYMPTOMS  OF  LOW  ABD  PAIN       L  SIDE  X  2  DAYS   DESCRIBED   AS  A  CRAMPING  SENSATION     DENYS  ANY  VAGINAL;  BLEEDING   DENYS  ANY  DISCHARGE           SHE  DENYS  ANY  DIARRHEA OR  VOMITING  SHE  DOES  REPORT  SOME  URINARY  FREQUENCY

## 2012-05-01 LAB — URINE CULTURE: Colony Count: 35000

## 2012-05-03 NOTE — ED Provider Notes (Signed)
History     CSN: [id number]  Arrival date & time 04/29/12  1425   First MD Initiated Contact with Patient 04/29/12 1500      Chief Complaint  Patient presents with  . Abdominal Pain    (Consider location/radiation/quality/duration/timing/severity/associated sxs/prior treatment) HPI Comments: 22 y/o female here c/o LLQ pain for 2 days. Symptoms associated with constipation and hard stools. Also reports urinary frequency but no burning on urination. She has a Mirena IUD. Denies vaginal discharge or vaginal bleeding. Denies nausea, vomiting or diarrhea. No fever or chills. Has had similar pain in past with normal pelvic ultrasound findings. No personal or family h/o diverticular or colon disease. Denies rectal pain or rectal bleeding.    Past Medical History  Diagnosis Date  . Preterm labor   . Pregnancy induced hypertension     Past Surgical History  Procedure Laterality Date  . No past surgeries      Family History  Problem Relation Age of Onset  . Alcohol abuse Mother   . Asthma Brother   . Stroke Maternal Uncle   . Cancer Maternal Grandmother     brain  . Diabetes Maternal Grandmother   . Diabetes Paternal Grandmother   . Hypertension Paternal Grandmother     History  Substance Use Topics  . Smoking status: Never Smoker   . Smokeless tobacco: Not on file  . Alcohol Use: Yes     Comment: occassionally    OB History   Grav Para Term Preterm Abortions TAB SAB Ect Mult Living   3 3 0 3  0 0 0 1 2      Review of Systems  Constitutional: Negative for fever, chills, activity change, appetite change and fatigue.  Gastrointestinal: Positive for abdominal pain and constipation. Negative for nausea, vomiting, diarrhea, blood in stool, abdominal distention, anal bleeding and rectal pain.  Genitourinary: Positive for frequency. Negative for dysuria, flank pain, vaginal bleeding and vaginal discharge.  Neurological: Negative for dizziness and headaches.    Allergies   Penicillins  Home Medications   Current Outpatient Rx  Name  Route  Sig  Dispense  Refill  . ciprofloxacin (CIPRO) 500 MG tablet   Oral   Take 1 tablet (500 mg total) by mouth every 12 (twelve) hours.   10 tablet   0   . levonorgestrel (MIRENA) 20 MCG/24HR IUD   Intrauterine   1 each by Intrauterine route once. Pt states that she had her Mirena placed 10/2009          . polyethylene glycol (MIRALAX / GLYCOLAX) packet   Oral   Take 17 g by mouth daily.   14 each   0   . traMADol (ULTRAM) 50 MG tablet   Oral   Take 1 tablet (50 mg total) by mouth every 6 (six) hours as needed for pain.   15 tablet   0     BP 107/72  Pulse 85  Temp(Src) 98.4 F (36.9 C) (Oral)  Resp 20  SpO2 100%  LMP 04/10/2012  Physical Exam  Nursing note and vitals reviewed. Constitutional: She is oriented to person, place, and time. She appears well-developed and well-nourished. No distress.  HENT:  Head: Normocephalic and atraumatic.  Cardiovascular: Normal heart sounds.   Pulmonary/Chest: Breath sounds normal.  Abdominal: Soft. Bowel sounds are normal. She exhibits no distension and no mass. There is no rebound and no guarding. Hernia confirmed negative in the right inguinal area and confirmed negative in the left inguinal area.  Reported discomfort with deep palpation over left lower quadrant. Stools palpated.   Genitourinary: Uterus normal. Rectal exam shows external hemorrhoid. Cervix exhibits no motion tenderness, no discharge and no friability. Right adnexum displays no mass, no tenderness and no fullness. Left adnexum displays no mass, no tenderness and no fullness. No tenderness around the vagina. Vaginal discharge found.  Lymphadenopathy:       Right: No inguinal adenopathy present.       Left: No inguinal adenopathy present.  Neurological: She is alert and oriented to person, place, and time.  Skin: No rash noted. She is not diaphoretic.    ED Course  Procedures (including  critical care time)  Labs Reviewed  POCT URINALYSIS DIP (DEVICE) - Abnormal; Notable for the following:    Protein, ur 30 (*)    Urobilinogen, UA 2.0 (*)    All other components within normal limits  URINE CULTURE  POCT PREGNANCY, URINE  CERVICOVAGINAL ANCILLARY ONLY  CERVICOVAGINAL ANCILLARY ONLY   No results found.   1. Constipation   2. Left lower quadrant pain       MDM  Reassuring abdominal exam. Urine culture, GC/CHL pending. Prescribed Cipro, miralax and tramadol. Supportive care including red flags that should prompt return to medical attention discussed with patient and provided in writing.  Asked to follow up with her primary care provider if recurrent symptoms as she might need image studies or endoscopy to r/o colon pathology.        Sharin Grave, MD 05/03/12 1051

## 2012-05-03 NOTE — ED Notes (Signed)
GC/Chlamydia neg., Affirm: Gardnerella pos., Candida and Trich neg.  Message sent to Dr. Tressia Danas. Kendra Fields 05/03/2012

## 2012-05-05 ENCOUNTER — Telehealth (HOSPITAL_COMMUNITY): Payer: Self-pay | Admitting: *Deleted

## 2012-05-05 MED ORDER — METRONIDAZOLE 500 MG PO TABS: 500.0000 mg | ORAL_TABLET | Freq: Two times a day (BID) | ORAL | Status: DC

## 2012-05-05 NOTE — ED Notes (Signed)
Urine culture: 35,000 colonies multiple bacterial types.  1619 Pt. notified if she continues to have symptoms of a UTI that she should get urine rechecked. Pt. voiced understanding. Vassie Moselle 05/05/2012

## 2012-05-05 NOTE — ED Notes (Signed)
I called pt.  Pt. verified x 2 and given results.  Pt. told she needs Flagyl for bacterial vaginosis. Pt. told the Rx. is at the [facility] on [address].   Pt. instructed to no alcohol while taking this medication.   Pt.'s questions about bacterial vaginosis answered. Pt. voiced understanding. Vassie Moselle 05/05/2012

## 2012-09-24 ENCOUNTER — Emergency Department (HOSPITAL_COMMUNITY)
Admission: EM | Admit: 2012-09-24 | Discharge: 2012-09-24 | Disposition: A | Discharge status: Home / Self Care | Payer: Self-pay | Attending: Emergency Medicine | Admitting: Emergency Medicine

## 2012-09-24 ENCOUNTER — Encounter (HOSPITAL_COMMUNITY): Payer: Self-pay | Admitting: Emergency Medicine

## 2012-09-24 DIAGNOSIS — K089 Disorder of teeth and supporting structures, unspecified: Secondary | ICD-10-CM | POA: Insufficient documentation

## 2012-09-24 DIAGNOSIS — K047 Periapical abscess without sinus: Secondary | ICD-10-CM | POA: Insufficient documentation

## 2012-09-24 DIAGNOSIS — K029 Dental caries, unspecified: Secondary | ICD-10-CM | POA: Insufficient documentation

## 2012-09-24 DIAGNOSIS — R220 Localized swelling, mass and lump, head: Secondary | ICD-10-CM

## 2012-09-24 DIAGNOSIS — K0889 Other specified disorders of teeth and supporting structures: Secondary | ICD-10-CM

## 2012-09-24 DIAGNOSIS — Z79899 Other long term (current) drug therapy: Secondary | ICD-10-CM | POA: Insufficient documentation

## 2012-09-24 DIAGNOSIS — K002 Abnormalities of size and form of teeth: Secondary | ICD-10-CM | POA: Insufficient documentation

## 2012-09-24 DIAGNOSIS — R599 Enlarged lymph nodes, unspecified: Secondary | ICD-10-CM | POA: Insufficient documentation

## 2012-09-24 DIAGNOSIS — Z792 Long term (current) use of antibiotics: Secondary | ICD-10-CM | POA: Insufficient documentation

## 2012-09-24 DIAGNOSIS — Z880 Allergy status to penicillin: Secondary | ICD-10-CM | POA: Insufficient documentation

## 2012-09-24 DIAGNOSIS — Z8742 Personal history of other diseases of the female genital tract: Secondary | ICD-10-CM | POA: Insufficient documentation

## 2012-09-24 MED ORDER — CLINDAMYCIN HCL 150 MG PO CAPS: 450.0000 mg | ORAL_CAPSULE | Freq: Three times a day (TID) | ORAL | Status: DC

## 2012-09-24 MED ORDER — HYDROCODONE-ACETAMINOPHEN 5-325 MG PO TABS: 1.0000 | ORAL_TABLET | Freq: Four times a day (QID) | ORAL | Status: DC | PRN

## 2012-09-24 NOTE — ED Notes (Signed)
Pt c/o right lower dental pain x 3 days with swelling this am

## 2012-09-24 NOTE — ED Provider Notes (Signed)
Medical screening examination/treatment/procedure(s) were conducted as a shared visit with non-physician practitioner(s) and myself.  I personally evaluated the patient during the encounter  Pt presents w/ dental pain and facial swelling.  PE c/w facial cellulitis.  No induration, fluctuance, fever, trismus.  I believe she is safe for outpt dental f/u, PO clinda.  Return precautions given for new or worsening symptoms including worsening pain, swelling, fever, trismus.   Shanna Cisco, MD 09/24/12 [phone]

## 2012-09-24 NOTE — Progress Notes (Signed)
ED CM received call from Triage RN in regards to patient in the lobby needing medication assistance. Pt was seen and discharged from ED with a prescription for Clindamycin. Spoke with patient she states, she cannot afford $90 nor, can she borrow the money. Medication not on [facility], [facility] $4 list. Pt eligible for the [facility] program. Explained the [facility] program and the guidelines, pt verbalized understanding and agreed. Pt enrolled match letter given. Provided patient with verbal and written about [facility], [facility] Card, and Medication assistance at the Department of Health. No further CM needs identified.

## 2012-09-24 NOTE — ED Notes (Signed)
Pt states she woke up this morning with swelling and pain to rt side of mouth. Pt rates pain 10/10. Pt states she had the tooth filled in, the filling fell out. Pt states pain has been going on since yesterday.

## 2012-09-24 NOTE — ED Provider Notes (Signed)
CSN: [id number]     Arrival date & time 09/24/12  1100 History   First MD Initiated Contact with Patient 09/24/12 1109     Chief Complaint  Patient presents with  . Dental Pain   (Consider location/radiation/quality/duration/timing/severity/associated sxs/prior Treatment) HPI Comments: 22 year old female who presents for right lower dental pain x3 days with worsening since this morning. Patient states that pain is aching and throbbing in nature, nonradiating, and constant. She has tried ibuprofen for the pain without relief. Symptoms are associated with facial swelling with onset this morning. Pain is worse with palpation and jaw movement. Patient denies associated fever, inability to swallow, drooling, oral lesions or bleeding, neck pain or stiffness, and shortness of breath. Patient denies established relationship with a dentist.  Patient is a 22 y.o. female presenting with tooth pain. The history is provided by the patient. No language interpreter was used.  Dental Pain Associated symptoms: facial swelling   Associated symptoms: no drooling, no fever and no neck pain     Past Medical History  Diagnosis Date  . Preterm labor   . Pregnancy induced hypertension    Past Surgical History  Procedure Laterality Date  . No past surgeries     Family History  Problem Relation Age of Onset  . Alcohol abuse Mother   . Asthma Brother   . Stroke Maternal Uncle   . Cancer Maternal Grandmother     brain  . Diabetes Maternal Grandmother   . Diabetes Paternal Grandmother   . Hypertension Paternal Grandmother    History  Substance Use Topics  . Smoking status: Never Smoker   . Smokeless tobacco: Not on file  . Alcohol Use: Yes     Comment: occassionally   OB History   Grav Para Term Preterm Abortions TAB SAB Ect Mult Living   3 3 0 3  0 0 0 1 2     Review of Systems  Constitutional: Negative for fever.  HENT: Positive for facial swelling and dental problem (pain). Negative for  drooling, trouble swallowing, neck pain and neck stiffness.   Respiratory: Negative for shortness of breath.   All other systems reviewed and are negative.   Allergies  Penicillins  Home Medications   Current Outpatient Rx  Name  Route  Sig  Dispense  Refill  . ciprofloxacin (CIPRO) 500 MG tablet   Oral   Take 1 tablet (500 mg total) by mouth every 12 (twelve) hours.   10 tablet   0   . clindamycin (CLEOCIN) 150 MG capsule   Oral   Take 3 capsules (450 mg total) by mouth 3 (three) times daily. May dispense as 150mg  capsules   90 capsule   0   . HYDROcodone-acetaminophen (NORCO/VICODIN) 5-325 MG per tablet   Oral   Take 1 tablet by mouth every 6 (six) hours as needed for pain.   7 tablet   0   . levonorgestrel (MIRENA) 20 MCG/24HR IUD   Intrauterine   1 each by Intrauterine route once. Pt states that she had her Mirena placed 10/2009          . metroNIDAZOLE (FLAGYL) 500 MG tablet   Oral   Take 1 tablet (500 mg total) by mouth 2 (two) times daily.   14 tablet   0   . polyethylene glycol (MIRALAX / GLYCOLAX) packet   Oral   Take 17 g by mouth daily.   14 each   0   . traMADol (ULTRAM) 50 MG tablet  Oral   Take 1 tablet (50 mg total) by mouth every 6 (six) hours as needed for pain.   15 tablet   0    BP 107/76  Pulse 74  Temp(Src) 98.3 F (36.8 C) (Oral)  Resp 18  Ht 5\' 4"  (1.626 m)  Wt 124 lb (56.246 kg)  BMI 21.27 kg/m2  SpO2 99%  Physical Exam  Nursing note and vitals reviewed. Constitutional: She is oriented to person, place, and time. She appears well-developed and well-nourished. No distress.  HENT:  Head: Normocephalic and atraumatic.  Right Ear: Tympanic membrane, external ear and ear canal normal. No mastoid tenderness.  Left Ear: Tympanic membrane, external ear and ear canal normal. No mastoid tenderness.  Nose: Nose normal.  Mouth/Throat: Uvula is midline, oropharynx is clear and moist and mucous membranes are normal. Abnormal  dentition. Dental abscesses and dental caries present. No edematous.    Slightly decreased jaw opening secondary to pain. Airway patent and uvula midline. Patient tolerating secretions without difficulty. Tenderness to palpation of the right lower first molar.  Eyes: Conjunctivae and EOM are normal. Pupils are equal, round, and reactive to light. No scleral icterus.  Neck: Normal range of motion. Neck supple.  Pulmonary/Chest: Effort normal. No stridor. No respiratory distress.  Musculoskeletal: Normal range of motion.  Lymphadenopathy:    She has cervical adenopathy (R anterior cervical).  Neurological: She is alert and oriented to person, place, and time.  Skin: Skin is warm and dry. No rash noted. She is not diaphoretic. No erythema. No pallor.  Psychiatric: She has a normal mood and affect. Her behavior is normal.   ED Course  Procedures (including critical care time) Labs Review Labs Reviewed - No data to display  Imaging Review No results found.  MDM   1. Right facial swelling   2. Pain, dental    Patient presents for R sided facial swelling and dental pain, worsening since this AM. Patient well and nontoxic appearing, hemodynamically stable, and afebrile. Physical exam findings as above. Uvula midline without evidence of peritonsillar abscess. There is no trismus or stridor noted, though slight discomfort with jaw opening. No area of fluctuance to suspect drainable dental abscess. No red flags or signs concerning for Ludwig's angina. Patient appropriate for d/c with dental follow up for further evaluation of symptoms. Rx for Clindamycin given in light of PCN allergy. Short course of Norco given for pain control. Return precautions advised and patient agreeable to plan with no unaddressed concerns. Patient seen also by Dr. Toy Cookey who is in agreement with this workup, assessment, management plan, and patient's stability for discharge.  Antony Madura, PA-C 09/24/12 1628

## 2012-10-27 ENCOUNTER — Emergency Department (HOSPITAL_COMMUNITY): Payer: Self-pay

## 2012-10-27 ENCOUNTER — Encounter (HOSPITAL_COMMUNITY): Payer: Self-pay

## 2012-10-27 ENCOUNTER — Emergency Department (HOSPITAL_COMMUNITY)
Admission: EM | Admit: 2012-10-27 | Discharge: 2012-10-27 | Disposition: A | Discharge status: Home / Self Care | Payer: Self-pay | Attending: Emergency Medicine | Admitting: Emergency Medicine

## 2012-10-27 DIAGNOSIS — Y9289 Other specified places as the place of occurrence of the external cause: Secondary | ICD-10-CM | POA: Insufficient documentation

## 2012-10-27 DIAGNOSIS — W19XXXA Unspecified fall, initial encounter: Secondary | ICD-10-CM

## 2012-10-27 DIAGNOSIS — R0781 Pleurodynia: Secondary | ICD-10-CM

## 2012-10-27 DIAGNOSIS — R109 Unspecified abdominal pain: Secondary | ICD-10-CM

## 2012-10-27 DIAGNOSIS — W010XXA Fall on same level from slipping, tripping and stumbling without subsequent striking against object, initial encounter: Secondary | ICD-10-CM | POA: Insufficient documentation

## 2012-10-27 DIAGNOSIS — S298XXA Other specified injuries of thorax, initial encounter: Secondary | ICD-10-CM | POA: Insufficient documentation

## 2012-10-27 DIAGNOSIS — Z8751 Personal history of pre-term labor: Secondary | ICD-10-CM | POA: Insufficient documentation

## 2012-10-27 DIAGNOSIS — S3981XA Other specified injuries of abdomen, initial encounter: Secondary | ICD-10-CM | POA: Insufficient documentation

## 2012-10-27 DIAGNOSIS — Y93E1 Activity, personal bathing and showering: Secondary | ICD-10-CM | POA: Insufficient documentation

## 2012-10-27 MED ORDER — IBUPROFEN 800 MG PO TABS: 800.0000 mg | ORAL_TABLET | Freq: Three times a day (TID) | ORAL | Status: DC

## 2012-10-27 NOTE — ED Notes (Signed)
She states she slipped and fell yesterday evening while attempting to get into her bathtub.  She c/o left lat. Lower rib area pain.  She denies cough, and is in no distress.

## 2012-10-27 NOTE — ED Provider Notes (Signed)
CSN: [id number]     Arrival date & time 10/27/12  1310 History  This chart was scribed for Kendra Horseman, PA, working with Harrold Donath R. Rubin Payor, MD, by Ardelia Mems ED Scribe. This patient was seen in room WTR9/WTR9 and the patient's care was started at 2:35 PM.   Chief Complaint  Patient presents with  . Rib Injury    The history is provided by the patient. No language interpreter was used.    HPI Comments: Kendra Fields is a 22 y.o. female who presents to the Emergency Department complaining of constant, moderate lower left-sided rib pain onset last night when pt states she accidentally slipped while trying to get out of the tub. She states that she was not dizzy prior to falling, but simply slipped. She states that her rib pain is worsened with bending and moving. She states that her pain is not worsened with deep inspiration. She states that she has taken Aleve without relief of pain. She states that she has no chronic medical conditions and that she is otherwise healthy. She denies cough, dizziness, SOB or any other symptoms.   Past Medical History  Diagnosis Date  . Preterm labor   . Pregnancy induced hypertension    Past Surgical History  Procedure Laterality Date  . No past surgeries     Family History  Problem Relation Age of Onset  . Alcohol abuse Mother   . Asthma Brother   . Stroke Maternal Uncle   . Cancer Maternal Grandmother     brain  . Diabetes Maternal Grandmother   . Diabetes Paternal Grandmother   . Hypertension Paternal Grandmother    History  Substance Use Topics  . Smoking status: Never Smoker   . Smokeless tobacco: Not on file  . Alcohol Use: Yes     Comment: occassionally   OB History   Grav Para Term Preterm Abortions TAB SAB Ect Mult Living   3 3 0 3  0 0 0 1 2     Review of Systems  All other systems reviewed and are negative.  A complete 10 system review of systems was obtained and all systems are negative except as noted in the HPI  and PMH.   Allergies  Penicillins  Home Medications   Current Outpatient Rx  Name  Route  Sig  Dispense  Refill  . levonorgestrel (MIRENA) 20 MCG/24HR IUD   Intrauterine   1 each by Intrauterine route once. Pt states that she had her Mirena placed 10/2009           Triage Vitals:BP 112/82  Pulse 88  Temp(Src) 98.5 F (36.9 C) (Oral)  Resp 20  SpO2 100%  Physical Exam  Nursing note and vitals reviewed. Constitutional: She is oriented to person, place, and time. She appears well-developed and well-nourished. No distress.  HENT:  Head: Normocephalic.  Eyes: EOM are normal.  Neck: Neck supple. No tracheal deviation present.  Cardiovascular: Normal rate.   Pulmonary/Chest: Effort normal. No respiratory distress. She exhibits no tenderness.  No bruising to the chest wall. No tenderness to palpation.   Abdominal: Soft. She exhibits no distension. There is tenderness.  Moderately tender in the LUQ. No bruising. No peritoneal signs.   Musculoskeletal: Normal range of motion.  No referred pain to the left shoulder.  Neurological: She is alert and oriented to person, place, and time.  Skin: Skin is warm and dry.  Psychiatric: She has a normal mood and affect. Her behavior is normal.  ED Course  Procedures (including critical care time)  DIAGNOSTIC STUDIES: Oxygen Saturation is 100% on RA, normal by my interpretation.    COORDINATION OF CARE: 2:40 PM- Discussed normal radiology findings. Discussed return precautions at length. Advised pt to return with dark bruising, abdominal pain, dizziness or hematuria. Pt advised of plan for treatment and pt agrees.  Labs Review Labs Reviewed - No data to display Imaging Review Dg Ribs Unilateral W/chest Left  10/27/2012   CLINICAL DATA:  Left anterior rib pain, fell 1 day ago, initial encounter  EXAM: LEFT RIBS AND CHEST - 3+ VIEW  COMPARISON:  None  FINDINGS: Normal heart size, mediastinal contours, and pulmonary vascularity.  Lungs  clear.  No pleural effusion or pneumothorax.  Osseous mineralization grossly normal for technique.  No definite rib fracture or bone destruction.  Minimal broad based dextro convex thoracic scoliosis.  IMPRESSION: No acute left rib abnormalities.   Electronically Signed   By: Ulyses Southward M.D.   On: 10/27/2012 14:19    MDM   1. Fall, initial encounter   2. Abdominal  pain, other specified site   3. Rib pain    Pt with rib/LUQ abdominal pain following mechanical fall. Suspect muscle strain/bruse. No bony abnormality or deformity. Plain films are negative. Pt is not in any apparent distress. No dizziness, no reported hematuria. No left shoulder pain. Doubt kidney or spleen injury. Specific return precautions were given. Pt understands and agrees with plan. She is stable and ready for discharge.   I personally performed the services described in this documentation, which was scribed in my presence. The recorded information has been reviewed and is accurate.     Kendra Horseman, PA-C 10/27/12 1851

## 2012-10-27 NOTE — Progress Notes (Signed)
[facility] provided pt with a list of primary care resources and a [facility] Card application.  °

## 2012-10-28 NOTE — ED Provider Notes (Signed)
Medical screening examination/treatment/procedure(s) were performed by non-physician practitioner and as supervising physician I was immediately available for consultation/collaboration.  Gerldine Suleiman R. Ameena Vesey, MD 10/28/12 1540 

## 2012-11-29 ENCOUNTER — Encounter (HOSPITAL_COMMUNITY): Payer: Self-pay | Admitting: Emergency Medicine

## 2012-11-29 ENCOUNTER — Emergency Department (HOSPITAL_COMMUNITY)
Admission: EM | Admit: 2012-11-29 | Discharge: 2012-11-29 | Disposition: A | Discharge status: Home / Self Care | Payer: Self-pay | Attending: Emergency Medicine | Admitting: Emergency Medicine

## 2012-11-29 ENCOUNTER — Emergency Department (HOSPITAL_COMMUNITY): Payer: Self-pay

## 2012-11-29 DIAGNOSIS — X500XXA Overexertion from strenuous movement or load, initial encounter: Secondary | ICD-10-CM | POA: Insufficient documentation

## 2012-11-29 DIAGNOSIS — Z8751 Personal history of pre-term labor: Secondary | ICD-10-CM | POA: Insufficient documentation

## 2012-11-29 DIAGNOSIS — M79671 Pain in right foot: Secondary | ICD-10-CM

## 2012-11-29 DIAGNOSIS — Z880 Allergy status to penicillin: Secondary | ICD-10-CM | POA: Insufficient documentation

## 2012-11-29 DIAGNOSIS — Y929 Unspecified place or not applicable: Secondary | ICD-10-CM | POA: Insufficient documentation

## 2012-11-29 DIAGNOSIS — Y9301 Activity, walking, marching and hiking: Secondary | ICD-10-CM | POA: Insufficient documentation

## 2012-11-29 DIAGNOSIS — S8990XA Unspecified injury of unspecified lower leg, initial encounter: Secondary | ICD-10-CM | POA: Insufficient documentation

## 2012-11-29 NOTE — ED Notes (Signed)
PT. CURRENTLY IN X-RAY.  
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
## 2012-11-29 NOTE — ED Provider Notes (Signed)
Medical screening examination/treatment/procedure(s) were performed by non-physician practitioner and as supervising physician I was immediately available for consultation/collaboration.  EKG Interpretation   None         David H Yao, MD 11/29/12 2244 
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
## 2013-09-18 ENCOUNTER — Encounter (HOSPITAL_COMMUNITY): Payer: Self-pay | Admitting: Emergency Medicine

## 2013-09-18 ENCOUNTER — Emergency Department (HOSPITAL_COMMUNITY)
Admission: EM | Admit: 2013-09-18 | Discharge: 2013-09-18 | Disposition: A | Discharge status: Home / Self Care | Payer: Self-pay | Attending: Emergency Medicine | Admitting: Emergency Medicine

## 2013-09-18 DIAGNOSIS — Z880 Allergy status to penicillin: Secondary | ICD-10-CM | POA: Insufficient documentation

## 2013-09-18 DIAGNOSIS — W503XXA Accidental bite by another person, initial encounter: Secondary | ICD-10-CM

## 2013-09-18 DIAGNOSIS — S21109A Unspecified open wound of unspecified front wall of thorax without penetration into thoracic cavity, initial encounter: Secondary | ICD-10-CM | POA: Insufficient documentation

## 2013-09-18 DIAGNOSIS — S20219A Contusion of unspecified front wall of thorax, initial encounter: Secondary | ICD-10-CM | POA: Insufficient documentation

## 2013-09-18 DIAGNOSIS — Z23 Encounter for immunization: Secondary | ICD-10-CM | POA: Insufficient documentation

## 2013-09-18 MED ORDER — CLINDAMYCIN HCL 300 MG PO CAPS: 300.0000 mg | ORAL_CAPSULE | Freq: Four times a day (QID) | ORAL | Status: DC

## 2013-09-18 MED ORDER — TETANUS-DIPHTH-ACELL PERTUSSIS 5-2.5-18.5 LF-MCG/0.5 IM SUSP
0.5000 mL | Freq: Once | INTRAMUSCULAR | Status: AC
  Administered 2013-09-18: 0.5 mL via INTRAMUSCULAR
  Filled 2013-09-18: qty 0.5
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
## 2013-09-19 NOTE — ED Provider Notes (Signed)
Medical screening examination/treatment/procedure(s) were performed by non-physician practitioner and as supervising physician I was immediately available for consultation/collaboration.   EKG Interpretation None        Elwin Mocha, MD 09/19/13 [phone]

## 2013-11-28 ENCOUNTER — Encounter (HOSPITAL_COMMUNITY): Payer: Self-pay | Admitting: Emergency Medicine

## 2014-05-19 DIAGNOSIS — Z880 Allergy status to penicillin: Secondary | ICD-10-CM | POA: Insufficient documentation

## 2014-05-19 DIAGNOSIS — R6883 Chills (without fever): Secondary | ICD-10-CM | POA: Insufficient documentation

## 2014-05-19 DIAGNOSIS — Z8619 Personal history of other infectious and parasitic diseases: Secondary | ICD-10-CM | POA: Insufficient documentation

## 2014-05-19 DIAGNOSIS — Z792 Long term (current) use of antibiotics: Secondary | ICD-10-CM | POA: Insufficient documentation

## 2014-05-19 DIAGNOSIS — R05 Cough: Secondary | ICD-10-CM | POA: Insufficient documentation

## 2014-05-19 DIAGNOSIS — R0981 Nasal congestion: Secondary | ICD-10-CM | POA: Insufficient documentation

## 2014-05-19 DIAGNOSIS — R52 Pain, unspecified: Secondary | ICD-10-CM | POA: Insufficient documentation

## 2014-05-19 DIAGNOSIS — J029 Acute pharyngitis, unspecified: Secondary | ICD-10-CM | POA: Insufficient documentation

## 2014-05-20 ENCOUNTER — Encounter (HOSPITAL_COMMUNITY): Payer: Self-pay | Admitting: Emergency Medicine

## 2014-05-20 ENCOUNTER — Emergency Department (HOSPITAL_COMMUNITY)
Admission: EM | Admit: 2014-05-20 | Discharge: 2014-05-20 | Disposition: A | Discharge status: Home / Self Care | Payer: Self-pay | Attending: Emergency Medicine | Admitting: Emergency Medicine

## 2014-05-20 ENCOUNTER — Emergency Department (HOSPITAL_COMMUNITY): Payer: Self-pay

## 2014-05-20 DIAGNOSIS — R6889 Other general symptoms and signs: Secondary | ICD-10-CM

## 2014-05-20 LAB — URINE MICROSCOPIC-ADD ON

## 2014-05-20 LAB — URINALYSIS, ROUTINE W REFLEX MICROSCOPIC
Glucose, UA: NEGATIVE mg/dL
HGB URINE DIPSTICK: NEGATIVE
Ketones, ur: 15 mg/dL — AB
Nitrite: NEGATIVE
PH: 6 (ref 5.0–8.0)
Protein, ur: NEGATIVE mg/dL
SPECIFIC GRAVITY, URINE: 1.027 (ref 1.005–1.030)
UROBILINOGEN UA: 1 mg/dL (ref 0.0–1.0)

## 2014-05-20 MED ORDER — ACETAMINOPHEN 325 MG PO TABS
650.0000 mg | ORAL_TABLET | Freq: Once | ORAL | Status: AC
  Administered 2014-05-20: 650 mg via ORAL
  Filled 2014-05-20: qty 2

## 2014-05-20 MED ORDER — BENZONATATE 100 MG PO CAPS: 100.0000 mg | ORAL_CAPSULE | Freq: Three times a day (TID) | ORAL | Status: DC

## 2014-05-20 MED ORDER — SALINE SPRAY 0.65 % NA SOLN: 1.0000 | NASAL | Status: DC | PRN

## 2014-05-20 MED ORDER — ACETAMINOPHEN 500 MG PO TABS: 500.0000 mg | ORAL_TABLET | Freq: Four times a day (QID) | ORAL | Status: DC | PRN

## 2014-05-20 NOTE — ED Provider Notes (Signed)
CSN: [id number]     Arrival date & time 05/19/14  2351 History   First MD Initiated Contact with Patient 05/20/14 0006     Chief Complaint  Patient presents with  . Generalized Body Aches     (Consider location/radiation/quality/duration/timing/severity/associated sxs/prior Treatment) HPI Comments: Patient is a 24 year old female who presents to the emergency department for further evaluation of body aches, nasal congestion, and cough. She states that symptoms began yesterday and have been persistent, without modifying factors. Patient reports taking 6 tablets of Aleve without relief of symptoms. She brought her daughter to the emergency department yesterday for evaluation of fever and cough. Patient was told that her daughter had a virus. She states that her urine has smelled strong, but she denies any dysuria or urinary frequency. She further denies fever, shortness of breath, nausea, vomiting, and diarrhea.  The history is provided by the patient. No language interpreter was used.    Past Medical History  Diagnosis Date  . Preterm labor   . Pregnancy induced hypertension    Past Surgical History  Procedure Laterality Date  . No past surgeries     Family History  Problem Relation Age of Onset  . Alcohol abuse Mother   . Asthma Brother   . Stroke Maternal Uncle   . Cancer Maternal Grandmother     brain  . Diabetes Maternal Grandmother   . Diabetes Paternal Grandmother   . Hypertension Paternal Grandmother    History  Substance Use Topics  . Smoking status: Never Smoker   . Smokeless tobacco: Not on file  . Alcohol Use: Yes     Comment: occassionally   OB History    Gravida Para Term Preterm AB TAB SAB Ectopic Multiple Living   3 3 0 3  0 0 0 1 2      Review of Systems  Constitutional: Positive for chills. Negative for fever.  HENT: Positive for congestion and sore throat. Negative for drooling and rhinorrhea.   Respiratory: Positive for cough. Negative for  shortness of breath.   Cardiovascular: Negative for chest pain.  Gastrointestinal: Negative for nausea, vomiting and diarrhea.  Musculoskeletal: Positive for myalgias.  All other systems reviewed and are negative.   Allergies  Penicillins  Home Medications   Prior to Admission medications   Medication Sig Start Date End Date Taking? Authorizing Provider  acetaminophen (TYLENOL) 500 MG tablet Take 1 tablet (500 mg total) by mouth every 6 (six) hours as needed. 05/20/14   Antony Madura, PA-C  benzonatate (TESSALON) 100 MG capsule Take 1 capsule (100 mg total) by mouth every 8 (eight) hours. 05/20/14   Antony Madura, PA-C  clindamycin (CLEOCIN) 300 MG capsule Take 1 capsule (300 mg total) by mouth 4 (four) times daily. X 5 days Patient not taking: Reported on 05/20/2014 09/18/13   Ivonne Andrew, PA-C  sodium chloride (OCEAN) 0.65 % SOLN nasal spray Place 1 spray into both nostrils as needed. 05/20/14   Antony Madura, PA-C   BP 103/66 mmHg  Pulse 104  Temp(Src) 99.8 F (37.7 C) (Oral)  Resp 18  Ht  (1.626 m)  Wt 125 lb (56.7 kg)  BMI 21.45 kg/m2  SpO2 96%   Physical Exam  Constitutional: She is oriented to person, place, and time. She appears well-developed and well-nourished. No distress.  Nontoxic/nonseptic appearing  HENT:  Head: Normocephalic and atraumatic.  Mouth/Throat: Oropharynx is clear and moist. No oropharyngeal exudate.  Oropharynx clear. Uvula midline. Patient tolerating secretions without difficulty  Eyes:  Conjunctivae and EOM are normal. No scleral icterus.  Neck: Normal range of motion.  No nuchal rigidity or meningismus  Cardiovascular: Normal rate, regular rhythm and intact distal pulses.   Patient not tachycardic as noted in triage  Pulmonary/Chest: Effort normal and breath sounds normal. No respiratory distress. She has no wheezes. She has no rales.  Lungs clear bilaterally. No accessory muscle use. Chest expansion symmetric.  Musculoskeletal: Normal range of  motion.  Neurological: She is alert and oriented to person, place, and time. She exhibits normal muscle tone. Coordination normal.  Skin: Skin is warm and dry. No rash noted. She is not diaphoretic. No erythema. No pallor.  Psychiatric: She has a normal mood and affect. Her behavior is normal.  Nursing note and vitals reviewed.   ED Course  Procedures (including critical care time) Labs Review Labs Reviewed  URINALYSIS, ROUTINE W REFLEX MICROSCOPIC - Abnormal; Notable for the following:    Color, Urine AMBER (*)    APPearance CLOUDY (*)    Bilirubin Urine SMALL (*)    Ketones, ur 15 (*)    Leukocytes, UA SMALL (*)    All other components within normal limits  URINE MICROSCOPIC-ADD ON - Abnormal; Notable for the following:    Squamous Epithelial / LPF MANY (*)    Bacteria, UA FEW (*)    All other components within normal limits    Imaging Review Dg Chest 2 View  05/20/2014   CLINICAL DATA:  Cough and generalized body aches.  EXAM: CHEST  2 VIEW  COMPARISON:  10/27/2012  FINDINGS: The heart size and mediastinal contours are within normal limits. Both lungs are clear. The visualized skeletal structures are unremarkable.  IMPRESSION: Normal exam.   Electronically Signed   By: Francene BoyersJames  Maxwell M.D.   On: 05/20/2014 01:10     EKG Interpretation None      MDM   Final diagnoses:  Flu-like symptoms    Patient with symptoms consistent with influenza. Hx of sick contacts as daughter recently seen and treated for viral illness. Vitals are stable, patient afebrile. No signs of dehydration; tolerating PO's. Lungs are clear and CXR negative for PNA. Patient to be discharged with instructions to orally hydrate, rest, and use over-the-counter medications such as Tylenol/ibuprofen for fever and body aches. Tessalon given for cough as well as Ocean nasal spray for congestion. Return precautions provided. Patient agreeable to plan with no unaddressed concerns. Patient discharged in good  condition.   Filed Vitals:   05/19/14 2357  BP: 103/66  Pulse: 104  Temp: 99.8 F (37.7 C)  TempSrc: Oral  Resp: 18  Height: 5\' 4"  (1.626 m)  Weight: 125 lb (56.7 kg)  SpO2: 96%       Antony MaduraKelly Yancy Hascall, PA-C 05/20/14 [id number]  Devoria AlbeIva Knapp, MD 05/20/14 [phone]

## 2014-05-20 NOTE — Discharge Instructions (Signed)

## 2014-05-20 NOTE — ED Notes (Signed)
Pt. reports generalized body aches with productive cough and nasal congestion onset yesterday , denies fever or chills.

## 2014-05-25 ENCOUNTER — Inpatient Hospital Stay (HOSPITAL_COMMUNITY)
Admission: AD | Admit: 2014-05-25 | Discharge: 2014-05-25 | Disposition: A | Discharge status: Home / Self Care | Payer: Self-pay | Source: Ambulatory Visit | Attending: Obstetrics & Gynecology | Admitting: Obstetrics & Gynecology

## 2014-05-25 ENCOUNTER — Encounter (HOSPITAL_COMMUNITY): Payer: Self-pay | Admitting: *Deleted

## 2014-05-25 DIAGNOSIS — N390 Urinary tract infection, site not specified: Secondary | ICD-10-CM | POA: Insufficient documentation

## 2014-05-25 DIAGNOSIS — A084 Viral intestinal infection, unspecified: Secondary | ICD-10-CM

## 2014-05-25 DIAGNOSIS — J069 Acute upper respiratory infection, unspecified: Secondary | ICD-10-CM | POA: Insufficient documentation

## 2014-05-25 LAB — URINALYSIS, ROUTINE W REFLEX MICROSCOPIC
Bilirubin Urine: NEGATIVE
Glucose, UA: NEGATIVE mg/dL
Ketones, ur: 15 mg/dL — AB
Leukocytes, UA: NEGATIVE
NITRITE: POSITIVE — AB
Protein, ur: 30 mg/dL — AB
Specific Gravity, Urine: 1.025 (ref 1.005–1.030)
Urobilinogen, UA: 2 mg/dL — ABNORMAL HIGH (ref 0.0–1.0)
pH: 5.5 (ref 5.0–8.0)

## 2014-05-25 LAB — URINE MICROSCOPIC-ADD ON

## 2014-05-25 LAB — POCT PREGNANCY, URINE: Preg Test, Ur: NEGATIVE

## 2014-05-25 MED ORDER — ACETAMINOPHEN 500 MG PO TABS
1000.0000 mg | ORAL_TABLET | Freq: Once | ORAL | Status: AC
  Administered 2014-05-25: 1000 mg via ORAL
  Filled 2014-05-25: qty 2

## 2014-05-25 MED ORDER — ONDANSETRON 8 MG PO TBDP: 8.0000 mg | ORAL_TABLET | Freq: Three times a day (TID) | ORAL | Status: DC | PRN

## 2014-05-25 MED ORDER — CIPROFLOXACIN HCL 500 MG PO TABS: 500.0000 mg | ORAL_TABLET | Freq: Two times a day (BID) | ORAL | Status: DC

## 2014-05-25 MED ORDER — PROMETHAZINE HCL 25 MG PO TABS
25.0000 mg | ORAL_TABLET | Freq: Once | ORAL | Status: AC
  Administered 2014-05-25: 25 mg via ORAL
  Filled 2014-05-25: qty 1

## 2014-05-25 MED ORDER — CEFTRIAXONE SODIUM 1 G IJ SOLR
500.0000 mg | Freq: Once | INTRAMUSCULAR | Status: AC
  Administered 2014-05-25: 500 mg via INTRAMUSCULAR
  Filled 2014-05-25: qty 10

## 2014-05-25 NOTE — MAU Provider Note (Signed)
History     CSN: [id number]  Arrival date and time: 05/25/14 [id number]   First Provider Initiated Contact with Patient 05/25/14 0135      No chief complaint on file.  HPI Comments: Kendra Fields is a 24 y.o. [phone] who presents today with cough and vomiting. She states that she has had the cough since she was last seen in the ED on 4/23. She states that the cough medication she had been given is not helping, and now she is vomiting. She states that she has had one episode of diarrhea today as well.  She is unsure of any fever at home.   Cough This is a new problem. The current episode started in the past 7 days. The problem has been unchanged. The cough is non-productive. Associated symptoms include chills. Nothing aggravates the symptoms. She has tried prescription cough suppressant and rest for the symptoms. The treatment provided no relief.  Emesis  This is a new problem. The current episode started in the past 7 days. The problem occurs 2 to 4 times per day. The emesis has an appearance of bile. The maximum temperature recorded prior to her arrival was 101 - 101.9 F. Associated symptoms include abdominal pain, chills, coughing and diarrhea. Risk factors include ill contacts (daughter had a fever, but no vomiting. ).    Past Medical History  Diagnosis Date  . Preterm labor   . Pregnancy induced hypertension     Past Surgical History  Procedure Laterality Date  . No past surgeries      Family History  Problem Relation Age of Onset  . Alcohol abuse Mother   . Asthma Brother   . Stroke Maternal Uncle   . Cancer Maternal Grandmother     brain  . Diabetes Maternal Grandmother   . Diabetes Paternal Grandmother   . Hypertension Paternal Grandmother     History  Substance Use Topics  . Smoking status: Never Smoker   . Smokeless tobacco: Not on file  . Alcohol Use: Yes     Comment: occassionally    Allergies:  Allergies  Allergen Reactions  . Penicillins Other (See  Comments)    Childhood reaction---unknown    Prescriptions prior to admission  Medication Sig Dispense Refill Last Dose  . acetaminophen (TYLENOL) 500 MG tablet Take 1 tablet (500 mg total) by mouth every 6 (six) hours as needed. 30 tablet 0   . benzonatate (TESSALON) 100 MG capsule Take 1 capsule (100 mg total) by mouth every 8 (eight) hours. 21 capsule 0   . clindamycin (CLEOCIN) 300 MG capsule Take 1 capsule (300 mg total) by mouth 4 (four) times daily. X 5 days (Patient not taking: Reported on 05/20/2014) 20 capsule 0 Completed Course at Unknown time  . sodium chloride (OCEAN) 0.65 % SOLN nasal spray Place 1 spray into both nostrils as needed. 1 Bottle 0     Review of Systems  Constitutional: Positive for chills.  Respiratory: Positive for cough.   Gastrointestinal: Positive for nausea, vomiting, abdominal pain and diarrhea. Negative for constipation.  Genitourinary: Negative for dysuria, urgency, frequency and flank pain.   Physical Exam   Blood pressure 103/70, pulse 103, temperature 101 F (38.3 C), temperature source Oral, resp. rate 20, height 5\' 4"  (1.626 m), weight 53.071 kg (117 lb), last menstrual period 05/20/2014, SpO2 99 %.  Physical Exam  Nursing note and vitals reviewed. Constitutional: She is oriented to person, place, and time. She appears well-developed and well-nourished. No distress.  Cardiovascular: Normal rate.   Respiratory: Effort normal and breath sounds normal. No respiratory distress.  GI: Soft. Bowel sounds are normal. There is no tenderness.  Genitourinary:  No CVA tenderness   Neurological: She is alert and oriented to person, place, and time.  Skin: Skin is warm and dry.  Psychiatric: She has a normal mood and affect.   Results for orders placed or performed during the hospital encounter of 05/25/14 (from the past 24 hour(s))  Urinalysis, Routine w reflex microscopic     Status: Abnormal   Collection Time: 05/25/14 12:55 AM  Result Value Ref  Range   Color, Urine YELLOW YELLOW   APPearance CLEAR CLEAR   Specific Gravity, Urine 1.025 1.005 - 1.030   pH 5.5 5.0 - 8.0   Glucose, UA NEGATIVE NEGATIVE mg/dL   Hgb urine dipstick MODERATE (A) NEGATIVE   Bilirubin Urine NEGATIVE NEGATIVE   Ketones, ur 15 (A) NEGATIVE mg/dL   Protein, ur 30 (A) NEGATIVE mg/dL   Urobilinogen, UA 2.0 (H) 0.0 - 1.0 mg/dL   Nitrite POSITIVE (A) NEGATIVE   Leukocytes, UA NEGATIVE NEGATIVE  Urine microscopic-add on     Status: Abnormal   Collection Time: 05/25/14 12:55 AM  Result Value Ref Range   Squamous Epithelial / LPF FEW (A) RARE   WBC, UA 3-6 <3 WBC/hpf   RBC / HPF 0-2 <3 RBC/hpf   Bacteria, UA MANY (A) RARE   Urine-Other MUCOUS PRESENT   Pregnancy, urine POC     Status: None   Collection Time: 05/25/14  2:27 AM  Result Value Ref Range   Preg Test, Ur NEGATIVE NEGATIVE    MAU Course  Procedures  MDM Patient given 500 mg rocephin IM here prior to DC to treat UTI.  Assessment and Plan   1. Viral gastroenteritis   2. Viral URI   3. UTI (lower urinary tract infection)    DC home RX: Cipro  BID X 5 days Zofran  ODT #20 FU with ED if no improvement in 24 hours  Follow-up Information    Follow up with [facility] [place].   Why:  As needed   Contact information:   [address] [place] [facility] [place] [address] [phone]      Tawnya Crook 05/25/2014, 1:35 AM

## 2014-05-25 NOTE — MAU Note (Signed)
Pt reports nausea and vomiting for the last 4 days, upper abd pain. Denies dysuria, fever.

## 2014-05-25 NOTE — Discharge Instructions (Signed)
Food Choices to Help Relieve Diarrhea When you have diarrhea, the foods you eat and your eating habits are very important. Choosing the right foods and drinks can help relieve diarrhea. Also, because diarrhea can last up to 7 days, you need to replace lost fluids and electrolytes (such as sodium, potassium, and chloride) in order to help prevent dehydration.  WHAT GENERAL GUIDELINES DO I NEED TO FOLLOW?  Slowly drink 1 cup (8 oz) of fluid for each episode of diarrhea. If you are getting enough fluid, your urine will be clear or pale yellow.  Eat starchy foods. Some good choices include white rice, white toast, pasta, low-fiber cereal, baked potatoes (without the skin), saltine crackers, and bagels.  Avoid large servings of any cooked vegetables.  Limit fruit to two servings per day. A serving is  cup or 1 small piece.  Choose foods with less than 2 g of fiber per serving.  Limit fats to less than 8 tsp (38 g) per day.  Avoid fried foods.  Eat foods that have probiotics in them. Probiotics can be found in certain dairy products.  Avoid foods and beverages that may increase the speed at which food moves through the stomach and intestines (gastrointestinal tract). Things to avoid include:  High-fiber foods, such as dried fruit, raw fruits and vegetables, nuts, seeds, and whole grain foods.  Spicy foods and high-fat foods.  Foods and beverages sweetened with high-fructose corn syrup, honey, or sugar alcohols such as xylitol, sorbitol, and mannitol. WHAT FOODS ARE RECOMMENDED? Grains White rice. White, [place]French, or pita breads (fresh or toasted), including plain rolls, buns, or bagels. White pasta. Saltine, soda, or graham crackers. Pretzels. Low-fiber cereal. Cooked cereals made with water (such as cornmeal, farina, or cream cereals). Plain muffins. Matzo. Melba toast. Zwieback.  Vegetables Potatoes (without the skin). Strained tomato and vegetable juices. Most well-cooked and canned  vegetables without seeds. Tender lettuce. Fruits Cooked or canned applesauce, apricots, cherries, fruit cocktail, grapefruit, peaches, pears, or plums. Fresh bananas, apples without skin, cherries, grapes, cantaloupe, grapefruit, peaches, oranges, or plums.  Meat and Other Protein Products Baked or boiled chicken. Eggs. Tofu. Fish. Seafood. Smooth peanut butter. Ground or well-cooked tender beef, ham, veal, lamb, pork, or poultry.  Dairy Plain yogurt, kefir, and unsweetened liquid yogurt. Lactose-free milk, buttermilk, or soy milk. Plain hard cheese. Beverages Sport drinks. Clear broths. Diluted fruit juices (except prune). Regular, caffeine-free sodas such as ginger ale. Water. Decaffeinated teas. Oral rehydration solutions. Sugar-free beverages not sweetened with sugar alcohols. Other Bouillon, broth, or soups made from recommended foods.  The items listed above may not be a complete list of recommended foods or beverages. Contact your dietitian for more options. WHAT FOODS ARE NOT RECOMMENDED? Grains Whole grain, whole wheat, bran, or rye breads, rolls, pastas, crackers, and cereals. Wild or brown rice. Cereals that contain more than 2 g of fiber per serving. Corn tortillas or taco shells. Cooked or dry oatmeal. Granola. Popcorn. Vegetables Raw vegetables. Cabbage, broccoli, Brussels sprouts, artichokes, baked beans, beet greens, corn, kale, legumes, peas, sweet potatoes, and yams. Potato skins. Cooked spinach and cabbage. Fruits Dried fruit, including raisins and dates. Raw fruits. Stewed or dried prunes. Fresh apples with skin, apricots, mangoes, pears, raspberries, and strawberries.  Meat and Other Protein Products Chunky peanut butter. Nuts and seeds. Beans and lentils. Tomasa BlaseBacon.  Dairy High-fat cheeses. Milk, chocolate milk, and beverages made with milk, such as milk shakes. Cream. Ice cream. Sweets and Desserts Sweet rolls, doughnuts, and sweet breads. Pancakes  and waffles. Fats and  Oils Butter. Cream sauces. Margarine. Salad oils. Plain salad dressings. Olives. Avocados.  Beverages Caffeinated beverages (such as coffee, tea, soda, or energy drinks). Alcoholic beverages. Fruit juices with pulp. Prune juice. Soft drinks sweetened with high-fructose corn syrup or sugar alcohols. Other Coconut. Hot sauce. Chili powder. Mayonnaise. Gravy. Cream-based or milk-based soups.  The items listed above may not be a complete list of foods and beverages to avoid. Contact your dietitian for more information. WHAT SHOULD I DO IF I BECOME DEHYDRATED? Diarrhea can sometimes lead to dehydration. Signs of dehydration include dark urine and dry mouth and skin. If you think you are dehydrated, you should rehydrate with an oral rehydration solution. These solutions can be purchased at pharmacies, retail stores, or online.  Drink -1 cup (120-240 mL) of oral rehydration solution each time you have an episode of diarrhea. If drinking this amount makes your diarrhea worse, try drinking smaller amounts more often. For example, drink 1-3 tsp (5-15 mL) every 5-10 minutes.  A general rule for staying hydrated is to drink 1-2 L of fluid per day. Talk to your health care provider about the specific amount you should be drinking each day. Drink enough fluids to keep your urine clear or pale yellow. Document Released: 04/05/2003 Document Revised: 01/18/2013 Document Reviewed: 12/06/2012 [facility] Patient Information 2015 [place], [place]. This information is not intended to replace advice given to you by your health care provider. Make sure you discuss any questions you have with your health care provider. Influenza Influenza ("the flu") is a viral infection of the respiratory tract. It occurs more often in winter months because people spend more time in close contact with one another. Influenza can make you feel very sick. Influenza easily spreads from person to person (contagious). CAUSES  Influenza is caused  by a virus that infects the respiratory tract. You can catch the virus by breathing in droplets from an infected person's cough or sneeze. You can also catch the virus by touching something that was recently contaminated with the virus and then touching your mouth, nose, or eyes. RISKS AND COMPLICATIONS You may be at risk for a more severe case of influenza if you smoke cigarettes, have diabetes, have chronic heart disease (such as heart failure) or lung disease (such as asthma), or if you have a weakened immune system. Elderly people and pregnant women are also at risk for more serious infections. The most common problem of influenza is a lung infection (pneumonia). Sometimes, this problem can require emergency medical care and may be life threatening. SIGNS AND SYMPTOMS  Symptoms typically last 4 to 10 days and may include:  Fever.  Chills.  Headache, body aches, and muscle aches.  Sore throat.  Chest discomfort and cough.  Poor appetite.  Weakness or feeling tired.  Dizziness.  Nausea or vomiting. DIAGNOSIS  Diagnosis of influenza is often made based on your history and a physical exam. A nose or throat swab test can be done to confirm the diagnosis. TREATMENT  In mild cases, influenza goes away on its own. Treatment is directed at relieving symptoms. For more severe cases, your health care provider may prescribe antiviral medicines to shorten the sickness. Antibiotic medicines are not effective because the infection is caused by a virus, not by bacteria. HOME CARE INSTRUCTIONS  Take medicines only as directed by your health care provider.  Use a cool mist humidifier to make breathing easier.  Get plenty of rest until your temperature returns to normal.  This usually takes 3 to 4 days.  Drink enough fluid to keep your urine clear or pale yellow.  Cover yourmouth and nosewhen coughing or sneezing,and wash your handswellto prevent thevirusfrom spreading.  Stay  homefromwork orschool untilthe fever is gonefor at least 56full day. PREVENTION  An annual influenza vaccination (flu shot) is the best way to avoid getting influenza. An annual flu shot is now routinely recommended for all adults in the [place] SEEK MEDICAL CARE IF:  You experiencechest pain, yourcough worsens,or you producemore mucus.  Youhave nausea,vomiting, ordiarrhea.  Your fever returns or gets worse. SEEK IMMEDIATE MEDICAL CARE IF:  You havetrouble breathing, you become short of breath,or your skin ornails becomebluish.  You have severe painor stiffnessin the neck.  You develop a sudden headache, or pain in the face or ear.  You have nausea or vomiting that you cannot control. MAKE SURE YOU:   Understand these instructions.  Will watch your condition.  Will get help right away if you are not doing well or get worse. Document Released: 01/11/2000 Document Revised: 05/30/2013 Document Reviewed: 04/14/2011 [facility] Patient Information 2015 [place], [place]. This information is not intended to replace advice given to you by your health care provider. Make sure you discuss any questions you have with your health care provider.

## 2014-05-25 NOTE — MAU Note (Signed)
Pt states she felt she was gettting sick Saturday and [facility], no Flu testing done. Pt states since then she has not been able to eat has vomiting and just started having diarrhea today.

## 2014-05-27 LAB — URINE CULTURE: Colony Count: >=100000

## 2014-06-02 ENCOUNTER — Emergency Department (HOSPITAL_COMMUNITY)
Admission: EM | Admit: 2014-06-02 | Discharge: 2014-06-02 | Disposition: A | Discharge status: Home / Self Care | Payer: No Typology Code available for payment source | Attending: Emergency Medicine | Admitting: Emergency Medicine

## 2014-06-02 ENCOUNTER — Emergency Department (HOSPITAL_COMMUNITY): Payer: No Typology Code available for payment source

## 2014-06-02 ENCOUNTER — Encounter (HOSPITAL_COMMUNITY): Payer: Self-pay | Admitting: Emergency Medicine

## 2014-06-02 DIAGNOSIS — Z720 Tobacco use: Secondary | ICD-10-CM | POA: Insufficient documentation

## 2014-06-02 DIAGNOSIS — Y92410 Unspecified street and highway as the place of occurrence of the external cause: Secondary | ICD-10-CM | POA: Diagnosis not present

## 2014-06-02 DIAGNOSIS — Y939 Activity, unspecified: Secondary | ICD-10-CM | POA: Diagnosis not present

## 2014-06-02 DIAGNOSIS — S199XXA Unspecified injury of neck, initial encounter: Secondary | ICD-10-CM | POA: Diagnosis present

## 2014-06-02 DIAGNOSIS — S161XXA Strain of muscle, fascia and tendon at neck level, initial encounter: Secondary | ICD-10-CM | POA: Insufficient documentation

## 2014-06-02 DIAGNOSIS — Y999 Unspecified external cause status: Secondary | ICD-10-CM | POA: Insufficient documentation

## 2014-06-02 DIAGNOSIS — Z880 Allergy status to penicillin: Secondary | ICD-10-CM | POA: Insufficient documentation

## 2014-06-02 DIAGNOSIS — S0990XA Unspecified injury of head, initial encounter: Secondary | ICD-10-CM | POA: Diagnosis not present

## 2014-06-02 MED ORDER — METHOCARBAMOL 500 MG PO TABS: 500.0000 mg | ORAL_TABLET | Freq: Three times a day (TID) | ORAL | Status: DC | PRN

## 2014-06-02 MED ORDER — KETOROLAC TROMETHAMINE 60 MG/2ML IM SOLN
60.0000 mg | Freq: Once | INTRAMUSCULAR | Status: AC
  Administered 2014-06-02: 60 mg via INTRAMUSCULAR
  Filled 2014-06-02: qty 2

## 2014-06-02 MED ORDER — METHOCARBAMOL 500 MG PO TABS
500.0000 mg | ORAL_TABLET | Freq: Once | ORAL | Status: AC
  Administered 2014-06-02: 500 mg via ORAL
  Filled 2014-06-02: qty 1

## 2014-06-02 MED ORDER — IBUPROFEN 600 MG PO TABS: 600.0000 mg | ORAL_TABLET | Freq: Three times a day (TID) | ORAL | Status: DC | PRN

## 2014-06-02 MED ORDER — OXYCODONE-ACETAMINOPHEN 5-325 MG PO TABS
1.0000 | ORAL_TABLET | Freq: Once | ORAL | Status: AC
  Administered 2014-06-02: 1 via ORAL
  Filled 2014-06-02: qty 1
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
## 2014-06-07 ENCOUNTER — Encounter (HOSPITAL_COMMUNITY): Payer: Self-pay

## 2014-06-07 ENCOUNTER — Other Ambulatory Visit (HOSPITAL_COMMUNITY)
Admission: RE | Admit: 2014-06-07 | Discharge: 2014-06-07 | Disposition: A | Discharge status: Home / Self Care | Payer: Self-pay | Source: Ambulatory Visit | Attending: Family Medicine | Admitting: Family Medicine

## 2014-06-07 ENCOUNTER — Emergency Department (INDEPENDENT_AMBULATORY_CARE_PROVIDER_SITE_OTHER)
Admission: EM | Admit: 2014-06-07 | Discharge: 2014-06-07 | Disposition: A | Discharge status: Home / Self Care | Payer: Self-pay | Source: Home / Self Care | Attending: Family Medicine | Admitting: Family Medicine

## 2014-06-07 DIAGNOSIS — Z113 Encounter for screening for infections with a predominantly sexual mode of transmission: Secondary | ICD-10-CM | POA: Insufficient documentation

## 2014-06-07 DIAGNOSIS — N760 Acute vaginitis: Secondary | ICD-10-CM | POA: Insufficient documentation

## 2014-06-07 DIAGNOSIS — A549 Gonococcal infection, unspecified: Secondary | ICD-10-CM

## 2014-06-07 LAB — POCT URINALYSIS DIP (DEVICE)
Bilirubin Urine: NEGATIVE
Glucose, UA: NEGATIVE mg/dL
HGB URINE DIPSTICK: NEGATIVE
Ketones, ur: NEGATIVE mg/dL
NITRITE: NEGATIVE
PROTEIN: 30 mg/dL — AB
Specific Gravity, Urine: 1.025 (ref 1.005–1.030)
Urobilinogen, UA: 1 mg/dL (ref 0.0–1.0)
pH: 7 (ref 5.0–8.0)

## 2014-06-07 LAB — POCT PREGNANCY, URINE: Preg Test, Ur: NEGATIVE

## 2014-06-07 MED ORDER — AZITHROMYCIN 250 MG PO TABS
1000.0000 mg | ORAL_TABLET | Freq: Once | ORAL | Status: AC
  Administered 2014-06-07: 1000 mg via ORAL

## 2014-06-07 MED ORDER — CEFTRIAXONE SODIUM 250 MG IJ SOLR
INTRAMUSCULAR | Status: AC
  Filled 2014-06-07: qty 250

## 2014-06-07 MED ORDER — CEFTRIAXONE SODIUM 250 MG IJ SOLR
250.0000 mg | Freq: Once | INTRAMUSCULAR | Status: AC
  Administered 2014-06-07: 250 mg via INTRAMUSCULAR

## 2014-06-07 MED ORDER — AZITHROMYCIN 250 MG PO TABS
ORAL_TABLET | ORAL | Status: AC
  Filled 2014-06-07: qty 4

## 2014-06-07 MED ORDER — LIDOCAINE HCL (PF) 1 % IJ SOLN
INTRAMUSCULAR | Status: AC
  Filled 2014-06-07: qty 5

## 2014-06-07 MED ORDER — METRONIDAZOLE 500 MG PO TABS: 500.0000 mg | ORAL_TABLET | Freq: Two times a day (BID) | ORAL | Status: DC

## 2014-06-07 NOTE — ED Provider Notes (Signed)
Kendra Fields is a 24 y.o. female who presents to Urgent Care today for gonorrhea. Patient's sexual partner recently tested positive for gonorrhea. She notes mild pelvic cramping. No fever, chills, NVD. No vaginal discharge. No urinary symptoms. No treatment tried.    Past Medical History  Diagnosis Date  . Preterm labor   . Pregnancy induced hypertension    Past Surgical History  Procedure Laterality Date  . No past surgeries     History  Substance Use Topics  . Smoking status: Current Every Day Smoker  . Smokeless tobacco: Not on file  . Alcohol Use: Yes     Comment: occassionally   ROS as above Medications: Current Facility-Administered Medications  Medication Dose Route Frequency Provider Last Rate Last Dose  . azithromycin (ZITHROMAX) tablet 1,000 mg  1,000 mg Oral Once Rodolph BongEvan S Brittony Billick, MD      . cefTRIAXone (ROCEPHIN) injection 250 mg  250 mg Intramuscular Once Rodolph BongEvan S Rutledge Selsor, MD       Current Outpatient Prescriptions  Medication Sig Dispense Refill  . acetaminophen (TYLENOL) 500 MG tablet Take 1 tablet (500 mg total) by mouth every 6 (six) hours as needed. 30 tablet 0  . benzonatate (TESSALON) 100 MG capsule Take 1 capsule (100 mg total) by mouth every 8 (eight) hours. (Patient not taking: Reported on 06/02/2014) 21 capsule 0  . ciprofloxacin (CIPRO) 500 MG tablet Take 1 tablet (500 mg total) by mouth 2 (two) times daily. (Patient not taking: Reported on 06/02/2014) 10 tablet 0  . clindamycin (CLEOCIN) 300 MG capsule Take 1 capsule (300 mg total) by mouth 4 (four) times daily. X 5 days (Patient not taking: Reported on 05/20/2014) 20 capsule 0  . ibuprofen (ADVIL,MOTRIN) 600 MG tablet Take 1 tablet (600 mg total) by mouth every 8 (eight) hours as needed. 15 tablet 0  . methocarbamol (ROBAXIN) 500 MG tablet Take 1 tablet (500 mg total) by mouth every 8 (eight) hours as needed for muscle spasms. 12 tablet 0  . naproxen sodium (ANAPROX) 220 MG tablet Take 220 mg by mouth 2 (two) times  daily as needed (for pain).    . ondansetron (ZOFRAN ODT) 8 MG disintegrating tablet Take 1 tablet (8 mg total) by mouth every 8 (eight) hours as needed for nausea or vomiting. (Patient not taking: Reported on 06/02/2014) 20 tablet 0  . sodium chloride (OCEAN) 0.65 % SOLN nasal spray Place 1 spray into both nostrils as needed. (Patient not taking: Reported on 06/02/2014) 1 Bottle 0   Allergies  Allergen Reactions  . Penicillins Other (See Comments)    Childhood reaction---unknown     Exam:  BP 112/86 mmHg  Pulse 84  Temp(Src) 98.2 F (36.8 C) (Oral)  Resp 16  SpO2 99%  LMP 05/20/2014 Gen: Well NAD HEENT: EOMI,  MMM Lungs: Normal work of breathing. CTABL Heart: RRR no MRG Abd: NABS, Soft. Nondistended, Nontender No rebound or guarding.  Exts: Brisk capillary refill, warm and well perfused.  GYN: Normal external genitalia. Normal vaginal canal. Normal Cervix.    Patient was given 1g oral azithromycin and 250mg  IM ceftriaxone  Results for orders placed or performed during the hospital encounter of 06/07/14 (from the past 24 hour(s))  POCT urinalysis dip (device)     Status: Abnormal   Collection Time: 06/07/14  4:24 PM  Result Value Ref Range   Glucose, UA NEGATIVE NEGATIVE mg/dL   Bilirubin Urine NEGATIVE NEGATIVE   Ketones, ur NEGATIVE NEGATIVE mg/dL   Specific Gravity, Urine 1.025  1.005 - 1.030   Hgb urine dipstick NEGATIVE NEGATIVE   pH 7.0 5.0 - 8.0   Protein, ur 30 (A) NEGATIVE mg/dL   Urobilinogen, UA 1.0 0.0 - 1.0 mg/dL   Nitrite NEGATIVE NEGATIVE   Leukocytes, UA TRACE (A) NEGATIVE  Pregnancy, urine POC     Status: None   Collection Time: 06/07/14  4:26 PM  Result Value Ref Range   Preg Test, Ur NEGATIVE NEGATIVE   No results found.  Assessment and Plan: 24 y.o. female with Gonorrhea. Treatment as above. Rx for flagyl at home as well. Vaginal cytology for BV, Yeast, gonorrhea Chlamydia and Trichomonas. Serology for HIV and syphilis also pending.  Discussed  warning signs or symptoms. Please see discharge instructions. Patient expresses understanding.     Rodolph BongEvan S Arina Torry, MD 06/07/14 [phone]
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
## 2014-06-07 NOTE — Discharge Instructions (Signed)
Thank you for coming in today. If your belly pain worsens, or you have high fever, bad vomiting, blood in your stool or black tarry stool go to the Emergency Room.   Gonorrhea Gonorrhea is an infection that can cause serious problems. If left untreated, the infection may:   Damage the female or female organs.   Cause women to be unable to have children (sterility).   Harm a fetus if the infected woman is pregnant.  It is important to get treatment for gonorrhea as soon as possible. It is also necessary that all your sexual partners be tested for the infection.  CAUSES  Gonorrhea is caused by bacteria called Neisseria gonorrhoeae. The infection is spread from person to person, usually by sexual contact (such as by anal, vaginal, or oral means). A newborn can contract the infection from his or her mother during birth.  SYMPTOMS  Some people with gonorrhea do not have symptoms. Symptoms may be different in females and males.  Females The most common symptoms are:   Pain in the lower abdomen.   Fever with or without chills.  Other symptoms include:   Abnormal vaginal discharge.   Painful intercourse.   Burning or itching of the vagina or lips of the vagina.   Abnormal vaginal bleeding.   Pain when urinating.   Long-lasting (chronic) pain in the lower abdomen, especially during menstruation or intercourse.   Inability to become pregnant.   Going into premature labor.   Irritation, pain, bleeding, or discharge from the rectum. This may occur if the infection was spread by anal sex.   Sore throat or swollen lymph nodes in the neck. This may occur if the infection was spread by oral sex.  Males The most common symptoms are:   Discharge from the penis.   Pain or burning during urination.   Pain or swelling in the testicles. Other symptoms may include:   Irritation, pain, bleeding, or discharge from the rectum. This may occur if the infection was spread by  anal sex.   Sore throat, fever, or swollen lymph nodes in the neck. This may occur if the infection was spread by oral sex.  DIAGNOSIS  A diagnosis is made after a physical exam is done and a sample of discharge is examined under a microscope for the presence of the bacteria. The discharge may be taken from the urethra, cervix, throat, or rectum.  TREATMENT  Gonorrhea is treated with antibiotic medicines. It is important for treatment to begin as soon as possible. Early treatment may prevent some problems from developing.  HOME CARE INSTRUCTIONS   Take medicines only as directed by your health care provider.   Take your antibiotic medicine as directed by your health care provider. Finish the antibiotic even if you start to feel better. Incomplete treatment will put you at risk for continued infection.   Do not have sex until treatment is complete or as directed by your health care provider.   Keep all follow-up visits as directed by your health care provider.   Not all test results are available during your visit. If your test results are not back during the visit, make an appointment with your health care provider to find out the results. Do not assume everything is normal if you have not heard from your health care provider or the medical facility. It is your responsibility to get your test results.  If you test positive for gonorrhea, inform your recent sexual partners. They need to be  checked for gonorrhea even if they do not have symptoms. They may need treatment, even if they test negative for gonorrhea.  SEEK MEDICAL CARE IF:   You develop any bad reaction to the medicine you were prescribed. This may include:   A rash.   Nausea.   Vomiting.   Diarrhea.   Your symptoms do not improve after a few days of taking antibiotics.   Your symptoms get worse.   You develop increased pain, such as in the testicles (for males) or in the abdomen (for females).  You have  a fever. MAKE SURE YOU:   Understand these instructions.  Will watch your condition.  Will get help right away if you are not doing well or get worse. Document Released: 01/11/2000 Document Revised: 05/30/2013 Document Reviewed: 07/21/2012 [facility] Patient Information 2015 [place], [place]. This information is not intended to replace advice given to you by your health care provider. Make sure you discuss any questions you have with your health care provider.

## 2014-06-08 LAB — HIV ANTIBODY (ROUTINE TESTING W REFLEX): HIV Screen 4th Generation wRfx: NONREACTIVE

## 2014-06-08 LAB — CERVICOVAGINAL ANCILLARY ONLY
Chlamydia: NEGATIVE
NEISSERIA GONORRHEA: POSITIVE — AB
Wet Prep (BD Affirm): POSITIVE — AB

## 2014-06-08 LAB — RPR: RPR Ser Ql: NONREACTIVE

## 2014-06-09 NOTE — ED Notes (Signed)
Final report of STD report available for review. Positive for GC. Treatment while in clinic adequate for infection ID'd. . Called patient, and after confirming ID, discussed positive findings. Advised to refrain from unprotected sex x 1 week, or for 1 week afer both partners are treated. Form 2124 [facility]

## 2014-06-15 ENCOUNTER — Encounter (HOSPITAL_COMMUNITY): Payer: Self-pay | Admitting: Emergency Medicine

## 2014-06-15 ENCOUNTER — Emergency Department (HOSPITAL_COMMUNITY)
Admission: EM | Admit: 2014-06-15 | Discharge: 2014-06-15 | Disposition: A | Discharge status: Home / Self Care | Payer: Self-pay | Attending: Emergency Medicine | Admitting: Emergency Medicine

## 2014-06-15 ENCOUNTER — Emergency Department (HOSPITAL_COMMUNITY): Payer: Self-pay

## 2014-06-15 DIAGNOSIS — Y999 Unspecified external cause status: Secondary | ICD-10-CM | POA: Insufficient documentation

## 2014-06-15 DIAGNOSIS — Z792 Long term (current) use of antibiotics: Secondary | ICD-10-CM | POA: Insufficient documentation

## 2014-06-15 DIAGNOSIS — S0591XA Unspecified injury of right eye and orbit, initial encounter: Secondary | ICD-10-CM

## 2014-06-15 DIAGNOSIS — Y929 Unspecified place or not applicable: Secondary | ICD-10-CM | POA: Insufficient documentation

## 2014-06-15 DIAGNOSIS — Z720 Tobacco use: Secondary | ICD-10-CM | POA: Insufficient documentation

## 2014-06-15 DIAGNOSIS — Y9389 Activity, other specified: Secondary | ICD-10-CM | POA: Insufficient documentation

## 2014-06-15 DIAGNOSIS — S1081XA Abrasion of other specified part of neck, initial encounter: Secondary | ICD-10-CM | POA: Insufficient documentation

## 2014-06-15 DIAGNOSIS — Z880 Allergy status to penicillin: Secondary | ICD-10-CM | POA: Insufficient documentation

## 2014-06-15 DIAGNOSIS — H1131 Conjunctival hemorrhage, right eye: Secondary | ICD-10-CM | POA: Insufficient documentation

## 2014-06-15 DIAGNOSIS — S00201A Unspecified superficial injury of right eyelid and periocular area, initial encounter: Secondary | ICD-10-CM | POA: Insufficient documentation

## 2014-06-15 MED ORDER — HYDROCODONE-ACETAMINOPHEN 5-325 MG PO TABS
2.0000 | ORAL_TABLET | Freq: Once | ORAL | Status: AC
  Administered 2014-06-15: 2 via ORAL
  Filled 2014-06-15: qty 2

## 2014-06-15 MED ORDER — HYDROCODONE-ACETAMINOPHEN 5-325 MG PO TABS: 2.0000 | ORAL_TABLET | ORAL | Status: DC | PRN

## 2014-06-15 NOTE — Discharge Instructions (Signed)
Take Vicodin as needed for pain. Refer to attached documents for more information.  °

## 2014-06-15 NOTE — ED Notes (Signed)
Pt back from CT
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
## 2014-06-15 NOTE — ED Notes (Signed)
Patient transported to CT 
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
## 2014-07-28 ENCOUNTER — Telehealth (HOSPITAL_COMMUNITY): Payer: Self-pay | Admitting: *Deleted

## 2014-07-28 NOTE — Telephone Encounter (Signed)
Telephoned patient at home # and left message to return call to [facility] 

## 2014-08-15 ENCOUNTER — Telehealth (HOSPITAL_COMMUNITY): Payer: Self-pay | Admitting: *Deleted

## 2014-08-15 NOTE — Telephone Encounter (Signed)
Telephoned patient at home # and left message to return call to [facility] 

## 2014-09-10 ENCOUNTER — Emergency Department (HOSPITAL_COMMUNITY)
Admission: EM | Admit: 2014-09-10 | Discharge: 2014-09-10 | Disposition: A | Discharge status: Home / Self Care | Payer: Self-pay | Attending: Emergency Medicine | Admitting: Emergency Medicine

## 2014-09-10 ENCOUNTER — Encounter (HOSPITAL_COMMUNITY): Payer: Self-pay

## 2014-09-10 ENCOUNTER — Emergency Department (HOSPITAL_COMMUNITY): Payer: Self-pay

## 2014-09-10 DIAGNOSIS — Z8751 Personal history of pre-term labor: Secondary | ICD-10-CM | POA: Insufficient documentation

## 2014-09-10 DIAGNOSIS — Z720 Tobacco use: Secondary | ICD-10-CM | POA: Insufficient documentation

## 2014-09-10 DIAGNOSIS — Y9289 Other specified places as the place of occurrence of the external cause: Secondary | ICD-10-CM | POA: Insufficient documentation

## 2014-09-10 DIAGNOSIS — Y9339 Activity, other involving climbing, rappelling and jumping off: Secondary | ICD-10-CM | POA: Insufficient documentation

## 2014-09-10 DIAGNOSIS — Z880 Allergy status to penicillin: Secondary | ICD-10-CM | POA: Insufficient documentation

## 2014-09-10 DIAGNOSIS — Y998 Other external cause status: Secondary | ICD-10-CM | POA: Insufficient documentation

## 2014-09-10 DIAGNOSIS — W1839XA Other fall on same level, initial encounter: Secondary | ICD-10-CM | POA: Insufficient documentation

## 2014-09-10 DIAGNOSIS — S9031XA Contusion of right foot, initial encounter: Secondary | ICD-10-CM | POA: Insufficient documentation

## 2014-09-10 MED ORDER — IBUPROFEN 800 MG PO TABS: 800.0000 mg | ORAL_TABLET | Freq: Three times a day (TID) | ORAL | Status: DC

## 2014-09-10 MED ORDER — IBUPROFEN 800 MG PO TABS
800.0000 mg | ORAL_TABLET | Freq: Once | ORAL | Status: AC
  Administered 2014-09-10: 800 mg via ORAL
  Filled 2014-09-10: qty 1

## 2014-09-10 NOTE — ED Provider Notes (Signed)
CSN: [id number]     Arrival date & time 09/10/14  2105 History  This chart was scribed for non-physician practitioner Elpidio Anis, PA-C working with Marily Memos, MD by Lyndel Safe, ED Scribe. This patient was seen in room WTR5/WTR5 and the patient's care was started at 10:03 PM.   Chief Complaint  Patient presents with  . Foot Pain   The history is provided by the patient. No language interpreter was used.   HPI Comments: Kendra Fields is a 24 y.o. female who presents to the Emergency Department complaining of sudden onset, constant, moderate right foot pain s/p fall that occurred 7 hours ago. Pt reports she was jumping a fence this morning when she fell and landed on her right foot. She has taken a muscle relaxer from her mother's prescription that provided no relief. Pt states her pain is exacerbated with weight bearing and ambulation. Denies any other arthralgias or myalgias.   Past Medical History  Diagnosis Date  . Preterm labor   . Pregnancy induced hypertension    Past Surgical History  Procedure Laterality Date  . No past surgeries     Family History  Problem Relation Age of Onset  . Alcohol abuse Mother   . Asthma Brother   . Stroke Maternal Uncle   . Cancer Maternal Grandmother     brain  . Diabetes Maternal Grandmother   . Diabetes Paternal Grandmother   . Hypertension Paternal Grandmother    Social History  Substance Use Topics  . Smoking status: Current Every Day Smoker    Types: Cigarettes  . Smokeless tobacco: None  . Alcohol Use: Yes     Comment: occassionally   OB History    Gravida Para Term Preterm AB TAB SAB Ectopic Multiple Living   3 3 0 3  0 0 0 1 2     Review of Systems  Musculoskeletal: Positive for arthralgias. Negative for myalgias.  Skin: Negative for wound.  Neurological: Negative for numbness.   Allergies  Penicillins  Home Medications   Prior to Admission medications   Medication Sig Start Date End Date Taking?  Authorizing Provider  TIZANIDINE HCL PO Take 1 tablet by mouth once.   Yes Historical Provider, MD  HYDROcodone-acetaminophen (NORCO/VICODIN) 5-325 MG per tablet Take 2 tablets by mouth every 4 (four) hours as needed. Patient not taking: Reported on 09/10/2014 06/15/14   Emilia Beck, PA-C  ibuprofen (ADVIL,MOTRIN) 600 MG tablet Take 1 tablet (600 mg total) by mouth every 8 (eight) hours as needed. Patient not taking: Reported on 09/10/2014 06/02/14   Azalia Bilis, MD  methocarbamol (ROBAXIN) 500 MG tablet Take 1 tablet (500 mg total) by mouth every 8 (eight) hours as needed for muscle spasms. Patient not taking: Reported on 09/10/2014 06/02/14   Azalia Bilis, MD  metroNIDAZOLE (FLAGYL) 500 MG tablet Take 1 tablet (500 mg total) by mouth 2 (two) times daily. Patient not taking: Reported on 09/10/2014 06/07/14   Rodolph Bong, MD   BP 113/77 mmHg  Pulse 90  Temp(Src) 98.2 F (36.8 C)  Resp 16  SpO2 100%  LMP 09/05/2014 Physical Exam  Constitutional: She appears well-developed and well-nourished.  HENT:  Head: Normocephalic and atraumatic.  Eyes: Conjunctivae are normal. Right eye exhibits no discharge. Left eye exhibits no discharge.  Pulmonary/Chest: Effort normal. No respiratory distress.  Musculoskeletal: Normal range of motion. She exhibits no edema or tenderness.  FROM of right ankle, no deformities, no swelling, no redness. Achilles tendon intact.  Neurological: She is alert. Coordination normal.  Skin: Skin is warm and dry. No rash noted. She is not diaphoretic. No erythema.  Psychiatric: She has a normal mood and affect.  Nursing note and vitals reviewed.  ED Course  Procedures  DIAGNOSTIC STUDIES: Oxygen Saturation is 100% on RA, normal by my interpretation.    COORDINATION OF CARE: 10:05 PM Discussed treatment plan which includes to order ASO splint and crutches with pt. Discussed unremarkable Xray results with pt. Pt acknowledges and agrees to plan.   Labs Review Labs  Reviewed - No data to display  Imaging Review Dg Foot Complete Right  09/10/2014   CLINICAL DATA:  Right foot pain following jumping over a fence, initial encounter  EXAM: RIGHT FOOT COMPLETE - 3+ VIEW  COMPARISON:  11/29/2012  FINDINGS: There is no evidence of fracture or dislocation. There is no evidence of arthropathy or other focal bone abnormality. Soft tissues are unremarkable.  IMPRESSION: No acute abnormality noted.   Electronically Signed   By: Alcide Clever M.D.   On: 09/10/2014 21:53   MDM   Final diagnoses:  None    1. Contusion right heel  Nonfracture, uncomplicated injury to right foot. ASO provided with crutches. Ortho referral for prn follow up if pain persists.    I personally performed the services described in this documentation, which was scribed in my presence. The recorded information has been reviewed and is accurate.      Elpidio Anis, PA-C 09/18/14 2306  Marily Memos, MD 09/19/14 [phone]

## 2014-09-10 NOTE — Discharge Instructions (Signed)
Cryotherapy °Cryotherapy means treatment with cold. Ice or gel packs can be used to reduce both pain and swelling. Ice is the most helpful within the first 24 to 48 hours after an injury or flare-up from overusing a muscle or joint. Sprains, strains, spasms, burning pain, shooting pain, and aches can all be eased with ice. Ice can also be used when recovering from surgery. Ice is effective, has very few side effects, and is safe for most people to use. °PRECAUTIONS  °Ice is not a safe treatment option for people with: °· Raynaud phenomenon. This is a condition affecting small blood vessels in the extremities. Exposure to cold may cause your problems to return. °· Cold hypersensitivity. There are many forms of cold hypersensitivity, including: °· Cold urticaria. Red, itchy hives appear on the skin when the tissues begin to warm after being iced. °· Cold erythema. This is a red, itchy rash caused by exposure to cold. °· Cold hemoglobinuria. Red blood cells break down when the tissues begin to warm after being iced. The hemoglobin that carry oxygen are passed into the urine because they cannot combine with blood proteins fast enough. °· Numbness or altered sensitivity in the area being iced. °If you have any of the following conditions, do not use ice until you have discussed cryotherapy with your caregiver: °· Heart conditions, such as arrhythmia, angina, or chronic heart disease. °· High blood pressure. °· Healing wounds or open skin in the area being iced. °· Current infections. °· Rheumatoid arthritis. °· Poor circulation. °· Diabetes. °Ice slows the blood flow in the region it is applied. This is beneficial when trying to stop inflamed tissues from spreading irritating chemicals to surrounding tissues. However, if you expose your skin to cold temperatures for too long or without the proper protection, you can damage your skin or nerves. Watch for signs of skin damage due to cold. °HOME CARE INSTRUCTIONS °Follow  these tips to use ice and cold packs safely. °· Place a dry or damp towel between the ice and skin. A damp towel will cool the skin more quickly, so you may need to shorten the time that the ice is used. °· For a more rapid response, add gentle compression to the ice. °· Ice for no more than 10 to 20 minutes at a time. The bonier the area you are icing, the less time it will take to get the benefits of ice. °· Check your skin after 5 minutes to make sure there are no signs of a poor response to cold or skin damage. °· Rest 20 minutes or more between uses. °· Once your skin is numb, you can end your treatment. You can test numbness by very lightly touching your skin. The touch should be so light that you do not see the skin dimple from the pressure of your fingertip. When using ice, most people will feel these normal sensations in this order: cold, burning, aching, and numbness. °· Do not use ice on someone who cannot communicate their responses to pain, such as small children or people with dementia. °HOW TO MAKE AN ICE PACK °Ice packs are the most common way to use ice therapy. Other methods include ice massage, ice baths, and cryosprays. Muscle creams that cause a cold, tingly feeling do not offer the same benefits that ice offers and should not be used as a substitute unless recommended by your caregiver. °To make an ice pack, do one of the following: °· Place crushed ice or a   bag of frozen vegetables in a sealable plastic bag. Squeeze out the excess air. Place this bag inside another plastic bag. Slide the bag into a pillowcase or place a damp towel between your skin and the bag. °· Mix 3 parts water with 1 part rubbing alcohol. Freeze the mixture in a sealable plastic bag. When you remove the mixture from the freezer, it will be slushy. Squeeze out the excess air. Place this bag inside another plastic bag. Slide the bag into a pillowcase or place a damp towel between your skin and the bag. °SEEK MEDICAL CARE  IF: °· You develop white spots on your skin. This may give the skin a blotchy (mottled) appearance. °· Your skin turns blue or pale. °· Your skin becomes waxy or hard. °· Your swelling gets worse. °MAKE SURE YOU:  °· Understand these instructions. °· Will watch your condition. °· Will get help right away if you are not doing well or get worse. °Document Released: 09/09/2010 Document Revised: 05/30/2013 Document Reviewed: 09/09/2010 °ExitCare® Patient Information ©2015 [employer]. This information is not intended to replace advice given to you by your health care provider. Make sure you discuss any questions you have with your health care provider. ° °Contusion °A contusion is a deep bruise. Contusions are the result of an injury that caused bleeding under the skin. The contusion may turn blue, purple, or yellow. Minor injuries will give you a painless contusion, but more severe contusions may stay painful and swollen for a few weeks.  °CAUSES  °A contusion is usually caused by a blow, trauma, or direct force to an area of the body. °SYMPTOMS  °· Swelling and redness of the injured area. °· Bruising of the injured area. °· Tenderness and soreness of the injured area. °· Pain. °DIAGNOSIS  °The diagnosis can be made by taking a history and physical exam. An X-ray, CT scan, or MRI may be needed to determine if there were any associated injuries, such as fractures. °TREATMENT  °Specific treatment will depend on what area of the body was injured. In general, the best treatment for a contusion is resting, icing, elevating, and applying cold compresses to the injured area. Over-the-counter medicines may also be recommended for pain control. Ask your caregiver what the best treatment is for your contusion. °HOME CARE INSTRUCTIONS  °· Put ice on the injured area. °¨ Put ice in a plastic bag. °¨ Place a towel between your skin and the bag. °¨ Leave the ice on for 15-20 minutes, 3-4 times a day, or as directed by your health  care provider. °· Only take over-the-counter or prescription medicines for pain, discomfort, or fever as directed by your caregiver. Your caregiver may recommend avoiding anti-inflammatory medicines (aspirin, ibuprofen, and naproxen) for 48 hours because these medicines may increase bruising. °· Rest the injured area. °· If possible, elevate the injured area to reduce swelling. °SEEK IMMEDIATE MEDICAL CARE IF:  °· You have increased bruising or swelling. °· You have pain that is getting worse. °· Your swelling or pain is not relieved with medicines. °MAKE SURE YOU:  °· Understand these instructions. °· Will watch your condition. °· Will get help right away if you are not doing well or get worse. °Document Released: 10/23/2004 Document Revised: 01/18/2013 Document Reviewed: 11/18/2010 °ExitCare® Patient Information ©2015 [employer]. This information is not intended to replace advice given to you by your health care provider. Make sure you discuss any questions you have with your health care provider. ° °

## 2014-09-10 NOTE — ED Notes (Signed)
Pt states she was jumping a fence early this morning and landed on her right foot and fell. She states she took a muscle relaxer from her mom but that did not relieve the pain.

## 2014-09-15 NOTE — ED Provider Notes (Signed)
Medical screening examination/treatment/procedure(s) were performed by non-physician practitioner and as supervising physician I was immediately available for consultation/collaboration.   EKG Interpretation None        Marily Memos, MD 09/15/14 [phone]

## 2014-12-06 ENCOUNTER — Inpatient Hospital Stay (HOSPITAL_COMMUNITY)
Admission: AD | Admit: 2014-12-06 | Discharge: 2014-12-06 | Disposition: A | Discharge status: Home / Self Care | Payer: Self-pay | Source: Ambulatory Visit | Attending: Family Medicine | Admitting: Family Medicine

## 2014-12-06 ENCOUNTER — Encounter (HOSPITAL_COMMUNITY): Payer: Self-pay | Admitting: *Deleted

## 2014-12-06 DIAGNOSIS — A5900 Urogenital trichomoniasis, unspecified: Secondary | ICD-10-CM

## 2014-12-06 DIAGNOSIS — Z113 Encounter for screening for infections with a predominantly sexual mode of transmission: Secondary | ICD-10-CM

## 2014-12-06 DIAGNOSIS — R109 Unspecified abdominal pain: Secondary | ICD-10-CM | POA: Insufficient documentation

## 2014-12-06 DIAGNOSIS — Z880 Allergy status to penicillin: Secondary | ICD-10-CM | POA: Insufficient documentation

## 2014-12-06 DIAGNOSIS — F17210 Nicotine dependence, cigarettes, uncomplicated: Secondary | ICD-10-CM | POA: Insufficient documentation

## 2014-12-06 DIAGNOSIS — A5901 Trichomonal vulvovaginitis: Secondary | ICD-10-CM | POA: Insufficient documentation

## 2014-12-06 DIAGNOSIS — Z3202 Encounter for pregnancy test, result negative: Secondary | ICD-10-CM | POA: Insufficient documentation

## 2014-12-06 LAB — URINALYSIS, ROUTINE W REFLEX MICROSCOPIC
BILIRUBIN URINE: NEGATIVE
GLUCOSE, UA: NEGATIVE mg/dL
Ketones, ur: NEGATIVE mg/dL
Nitrite: NEGATIVE
PROTEIN: NEGATIVE mg/dL
Specific Gravity, Urine: 1.015 (ref 1.005–1.030)
UROBILINOGEN UA: 0.2 mg/dL (ref 0.0–1.0)
pH: 7 (ref 5.0–8.0)

## 2014-12-06 LAB — POCT PREGNANCY, URINE: PREG TEST UR: NEGATIVE

## 2014-12-06 LAB — URINE MICROSCOPIC-ADD ON

## 2014-12-06 LAB — WET PREP, GENITAL: Yeast Wet Prep HPF POC: NONE SEEN

## 2014-12-06 MED ORDER — METRONIDAZOLE 500 MG PO TABS
2000.0000 mg | ORAL_TABLET | Freq: Once | ORAL | Status: AC
  Administered 2014-12-06: 2000 mg via ORAL
  Filled 2014-12-06: qty 4

## 2014-12-06 NOTE — MAU Note (Signed)
Went into patient's room to discharge her but she had already left. I unable to get her to sign and give her her discharge papers.

## 2014-12-06 NOTE — Discharge Instructions (Signed)
Trichomoniasis Trichomoniasis is an infection caused by an organism called Trichomonas. The infection can affect both women and men. In women, the outer female genitalia and the vagina are affected. In men, the penis is mainly affected, but the prostate and other reproductive organs can also be involved. Trichomoniasis is a sexually transmitted infection (STI) and is most often passed to another person through sexual contact.  RISK FACTORS  Having unprotected sexual intercourse.  Having sexual intercourse with an infected partner. SIGNS AND SYMPTOMS  Symptoms of trichomoniasis in women include:  Abnormal gray-green frothy vaginal discharge.  Itching and irritation of the vagina.  Itching and irritation of the area outside the vagina. Symptoms of trichomoniasis in men include:   Penile discharge with or without pain.  Pain during urination. This results from inflammation of the urethra. DIAGNOSIS  Trichomoniasis may be found during a Pap test or physical exam. Your health care provider may use one of the following methods to help diagnose this infection:  Testing the pH of the vagina with a test tape.  Using a vaginal swab test that checks for the Trichomonas organism. A test is available that provides results within a few minutes.  Examining a urine sample.  Testing vaginal secretions. Your health care provider may test you for other STIs, including HIV. TREATMENT   You may be given medicine to fight the infection. Women should inform their health care provider if they could be or are pregnant. Some medicines used to treat the infection should not be taken during pregnancy.  Your health care provider may recommend over-the-counter medicines or creams to decrease itching or irritation.  Your sexual partner will need to be treated if infected.  Your health care provider may test you for infection again 3 months after treatment. HOME CARE INSTRUCTIONS   Take medicines only as  directed by your health care provider.  Take over-the-counter medicine for itching or irritation as directed by your health care provider.  Do not have sexual intercourse while you have the infection.  Women should not douche or wear tampons while they have the infection.  Discuss your infection with your partner. Your partner may have gotten the infection from you, or you may have gotten it from your partner.  Have your sex partner get examined and treated if necessary.  Practice safe, informed, and protected sex.  See your health care provider for other STI testing. SEEK MEDICAL CARE IF:   You still have symptoms after you finish your medicine.  You develop abdominal pain.  You have pain when you urinate.  You have bleeding after sexual intercourse.  You develop a rash.  Your medicine makes you sick or makes you throw up (vomit). MAKE SURE YOU:  Understand these instructions.  Will watch your condition.  Will get help right away if you are not doing well or get worse.   This information is not intended to replace advice given to you by your health care provider. Make sure you discuss any questions you have with your health care provider.   Document Released: 07/09/2000 Document Revised: 02/03/2014 Document Reviewed: 10/25/2012 Elsevier Interactive Patient Education 2016 [employer]  

## 2014-12-06 NOTE — MAU Note (Signed)
Pt presents to MAU with complaints of irregular menstrual cycles and lower abdominal pain. Denies any abnormal discharge

## 2014-12-06 NOTE — MAU Provider Note (Signed)
History     CSN: [id number]  Arrival date and time: 12/06/14 1142   First Provider Initiated Contact with Patient 12/06/14 1359      Chief Complaint  Patient presents with  . Abdominal Pain  . irregular cycle    HPI Mariel SleetKynesha O Mckey 24 y.o. [id number] nonpregnant female presents complaining of 2 months of having menstrual cycle twice per month.  She has had abdominal pain for the last week.  Abdominal pain now an 8/10 but can be as much as 10/10.  It is located mid lower abdomen.  It is worse with palpation.  No change with eating.  She has an increase in vaginal discharge.  She denies nausea, vomiting, odor, dysuria, fever, weakness, HA, CP, SOB.   OB History    Gravida Para Term Preterm AB TAB SAB Ectopic Multiple Living   3 3 0 3  0 0 0 1 2      Past Medical History  Diagnosis Date  . Preterm labor   . Pregnancy induced hypertension     Past Surgical History  Procedure Laterality Date  . No past surgeries      Family History  Problem Relation Age of Onset  . Alcohol abuse Mother   . Asthma Brother   . Stroke Maternal Uncle   . Cancer Maternal Grandmother     brain  . Diabetes Maternal Grandmother   . Diabetes Paternal Grandmother   . Hypertension Paternal Grandmother     Social History  Substance Use Topics  . Smoking status: Current Every Day Smoker    Types: Cigarettes  . Smokeless tobacco: None  . Alcohol Use: Yes     Comment: occassionally    Allergies:  Allergies  Allergen Reactions  . Penicillins Other (See Comments)    Childhood reaction---unknown    Prescriptions prior to admission  Medication Sig Dispense Refill Last Dose  . HYDROcodone-acetaminophen (NORCO/VICODIN) 5-325 MG per tablet Take 2 tablets by mouth every 4 (four) hours as needed. (Patient not taking: Reported on 09/10/2014) 12 tablet 0 Completed Course at Unknown time  . ibuprofen (ADVIL,MOTRIN) 800 MG tablet Take 1 tablet (800 mg total) by mouth 3 (three) times daily. (Patient not  taking: Reported on 12/06/2014) 21 tablet 0   . methocarbamol (ROBAXIN) 500 MG tablet Take 1 tablet (500 mg total) by mouth every 8 (eight) hours as needed for muscle spasms. (Patient not taking: Reported on 09/10/2014) 12 tablet 0 Completed Course at Unknown time  . metroNIDAZOLE (FLAGYL) 500 MG tablet Take 1 tablet (500 mg total) by mouth 2 (two) times daily. (Patient not taking: Reported on 09/10/2014) 14 tablet 0 Completed Course at Unknown time    ROS Pertinent ROS in HPI.  All other systems are negative.   Physical Exam   Blood pressure 117/83, pulse 84, temperature 98.7 F (37.1 C), resp. rate 18, height 5\' 5"  (1.651 m), weight 119 lb (53.978 kg), last menstrual period 11/12/2014.  Physical Exam  Constitutional: She is oriented to person, place, and time. She appears well-developed and well-nourished. No distress.  HENT:  Head: Normocephalic and atraumatic.  Eyes: Conjunctivae and EOM are normal.  Neck: Normal range of motion. Neck supple.  Cardiovascular: Normal rate, regular rhythm and normal heart sounds.   Respiratory: Effort normal and breath sounds normal. No respiratory distress.  GI: Soft. Bowel sounds are normal. She exhibits no distension. There is tenderness.  Tender to palpation in suprapubic region  Genitourinary:  Large amt of frothy white discharge No  CMT.  No adnexal mass or tenderness  Musculoskeletal: Normal range of motion. She exhibits no edema.  Neurological: She is alert and oriented to person, place, and time.  Skin: Skin is warm and dry.  Psychiatric: She has a normal mood and affect. Her behavior is normal.   Results for orders placed or performed during the hospital encounter of 12/06/14 (from the past 24 hour(s))  Urinalysis, Routine w reflex microscopic (not at [facility])     Status: Abnormal   Collection Time: 12/06/14 12:20 PM  Result Value Ref Range   Color, Urine YELLOW YELLOW   APPearance CLOUDY (A) CLEAR   Specific Gravity, Urine 1.015 1.005 -  1.030   pH 7.0 5.0 - 8.0   Glucose, UA NEGATIVE NEGATIVE mg/dL   Hgb urine dipstick SMALL (A) NEGATIVE   Bilirubin Urine NEGATIVE NEGATIVE   Ketones, ur NEGATIVE NEGATIVE mg/dL   Protein, ur NEGATIVE NEGATIVE mg/dL   Urobilinogen, UA 0.2 0.0 - 1.0 mg/dL   Nitrite NEGATIVE NEGATIVE   Leukocytes, UA LARGE (A) NEGATIVE  Urine microscopic-add on     Status: Abnormal   Collection Time: 12/06/14 12:20 PM  Result Value Ref Range   Squamous Epithelial / LPF FEW (A) RARE   WBC, UA 3-6 <3 WBC/hpf   Bacteria, UA FEW (A) RARE   Urine-Other TRICHOMONAS PRESENT   Pregnancy, urine POC     Status: None   Collection Time: 12/06/14 12:34 PM  Result Value Ref Range   Preg Test, Ur NEGATIVE NEGATIVE    MAU Course  Procedures  MDM Trichomonas noted on U/A: Flagyl 2 grams ordered.  Further STD testing performed Pregnancy test negative  Assessment and Plan  A: Trichomonas STD screening  P: Discharge to home Trich treated in MAU Refer partners to HD for treatment/testing No IC /etoh x 10 days Clean all toys F/u with HD prn Patient may return to MAU as needed or if her condition were to change or worsen   Bertram Denver 12/06/2014, 2:00 PM

## 2014-12-07 ENCOUNTER — Encounter (HOSPITAL_COMMUNITY): Payer: Self-pay | Admitting: *Deleted

## 2014-12-07 LAB — GC/CHLAMYDIA PROBE AMP (~~LOC~~) NOT AT ARMC
Chlamydia: POSITIVE — AB
Neisseria Gonorrhea: NEGATIVE

## 2014-12-07 LAB — HIV ANTIBODY (ROUTINE TESTING W REFLEX): HIV Screen 4th Generation wRfx: NONREACTIVE

## 2014-12-07 LAB — RPR: RPR: NONREACTIVE

## 2014-12-18 ENCOUNTER — Telehealth (HOSPITAL_COMMUNITY): Payer: Self-pay | Admitting: Family Medicine

## 2014-12-18 DIAGNOSIS — A749 Chlamydial infection, unspecified: Secondary | ICD-10-CM

## 2014-12-18 MED ORDER — AZITHROMYCIN 500 MG PO TABS
ORAL_TABLET | ORAL | Status: DC
Start: 2014-12-18 — End: 2015-03-05

## 2014-12-18 NOTE — Telephone Encounter (Signed)
Patient returned call from certified letter that was mailed to her.  Patient notified of positive chlamydia culture from 12/06/14 visit to MAU.  Rx routed to pharmacy per protocol.  Instructed patient to notify her partner for treatment and to abstain from sex for seven days post treatment.  Report of treatment faxed to health department.

## 2014-12-28 DIAGNOSIS — A5901 Trichomonal vulvovaginitis: Secondary | ICD-10-CM

## 2014-12-28 DIAGNOSIS — A749 Chlamydial infection, unspecified: Secondary | ICD-10-CM

## 2014-12-28 HISTORY — DX: Chlamydial infection, unspecified: A74.9

## 2014-12-28 HISTORY — DX: Trichomonal vulvovaginitis: A59.01

## 2015-03-05 ENCOUNTER — Encounter (HOSPITAL_COMMUNITY): Payer: Self-pay | Admitting: Cardiology

## 2015-03-05 ENCOUNTER — Emergency Department (HOSPITAL_COMMUNITY)
Admission: EM | Admit: 2015-03-05 | Discharge: 2015-03-05 | Disposition: A | Discharge status: Home / Self Care | Payer: Self-pay | Attending: Emergency Medicine | Admitting: Emergency Medicine

## 2015-03-05 DIAGNOSIS — R300 Dysuria: Secondary | ICD-10-CM

## 2015-03-05 DIAGNOSIS — A5901 Trichomonal vulvovaginitis: Secondary | ICD-10-CM | POA: Insufficient documentation

## 2015-03-05 DIAGNOSIS — N760 Acute vaginitis: Secondary | ICD-10-CM | POA: Insufficient documentation

## 2015-03-05 DIAGNOSIS — N3000 Acute cystitis without hematuria: Secondary | ICD-10-CM | POA: Insufficient documentation

## 2015-03-05 DIAGNOSIS — F17210 Nicotine dependence, cigarettes, uncomplicated: Secondary | ICD-10-CM | POA: Insufficient documentation

## 2015-03-05 DIAGNOSIS — Z3202 Encounter for pregnancy test, result negative: Secondary | ICD-10-CM | POA: Insufficient documentation

## 2015-03-05 DIAGNOSIS — Z880 Allergy status to penicillin: Secondary | ICD-10-CM | POA: Insufficient documentation

## 2015-03-05 DIAGNOSIS — B9689 Other specified bacterial agents as the cause of diseases classified elsewhere: Secondary | ICD-10-CM

## 2015-03-05 HISTORY — DX: Chlamydial infection, unspecified: A74.9

## 2015-03-05 HISTORY — DX: Trichomonal vulvovaginitis: A59.01

## 2015-03-05 LAB — URINALYSIS, ROUTINE W REFLEX MICROSCOPIC
Bilirubin Urine: NEGATIVE
GLUCOSE, UA: NEGATIVE mg/dL
Hgb urine dipstick: NEGATIVE
Ketones, ur: NEGATIVE mg/dL
Nitrite: NEGATIVE
PH: 6 (ref 5.0–8.0)
Protein, ur: NEGATIVE mg/dL
SPECIFIC GRAVITY, URINE: 1.017 (ref 1.005–1.030)

## 2015-03-05 LAB — RPR: RPR Ser Ql: NONREACTIVE

## 2015-03-05 LAB — WET PREP, GENITAL
Sperm: NONE SEEN
YEAST WET PREP: NONE SEEN

## 2015-03-05 LAB — RAPID HIV SCREEN (HIV 1/2 AB+AG)
HIV 1/2 ANTIBODIES: NONREACTIVE
HIV-1 P24 Antigen - HIV24: NONREACTIVE

## 2015-03-05 LAB — POC URINE PREG, ED: Preg Test, Ur: NEGATIVE

## 2015-03-05 LAB — URINE MICROSCOPIC-ADD ON

## 2015-03-05 MED ORDER — LIDOCAINE HCL (PF) 1 % IJ SOLN
0.9000 mL | Freq: Once | INTRAMUSCULAR | Status: AC
  Administered 2015-03-05: 5 mL

## 2015-03-05 MED ORDER — LIDOCAINE HCL (PF) 1 % IJ SOLN
INTRAMUSCULAR | Status: AC
  Administered 2015-03-05: 5 mL
  Filled 2015-03-05: qty 5

## 2015-03-05 MED ORDER — NITROFURANTOIN MONOHYD MACRO 100 MG PO CAPS: 100.0000 mg | ORAL_CAPSULE | Freq: Two times a day (BID) | ORAL | Status: DC

## 2015-03-05 MED ORDER — CEFTRIAXONE SODIUM 250 MG IJ SOLR
250.0000 mg | Freq: Once | INTRAMUSCULAR | Status: AC
  Administered 2015-03-05: 250 mg via INTRAMUSCULAR
  Filled 2015-03-05: qty 250

## 2015-03-05 MED ORDER — AZITHROMYCIN 250 MG PO TABS
1000.0000 mg | ORAL_TABLET | Freq: Once | ORAL | Status: AC
  Administered 2015-03-05: 1000 mg via ORAL
  Filled 2015-03-05: qty 4

## 2015-03-05 MED ORDER — METRONIDAZOLE 500 MG PO TABS
500.0000 mg | ORAL_TABLET | Freq: Once | ORAL | Status: AC
  Administered 2015-03-05: 500 mg via ORAL
  Filled 2015-03-05: qty 1

## 2015-03-05 MED ORDER — PHENAZOPYRIDINE HCL 200 MG PO TABS: 200.0000 mg | ORAL_TABLET | Freq: Three times a day (TID) | ORAL | Status: DC

## 2015-03-05 MED ORDER — METRONIDAZOLE 500 MG PO TABS: 500.0000 mg | ORAL_TABLET | Freq: Two times a day (BID) | ORAL | Status: DC

## 2015-03-05 MED ORDER — NITROFURANTOIN MONOHYD MACRO 100 MG PO CAPS
100.0000 mg | ORAL_CAPSULE | Freq: Once | ORAL | Status: AC
  Administered 2015-03-05: 100 mg via ORAL
  Filled 2015-03-05: qty 1

## 2015-03-05 NOTE — Discharge Instructions (Signed)
You have been seen today for pain with urination and possible STD exposure. Your labs showed that you have Trichomonas and bacterial vaginosis. You will be treated for both over the course of the next 7 days. They're both treated with the Flagyl. The results of your gonorrhea and chlamydia labs will be called to you if they're positive. You have already received the required treatment for both of these issues. Follow up with PCP as needed. Return to ED should symptoms worsen. Please take all of your antibiotics until finished!   You may develop abdominal discomfort or diarrhea from the antibiotic.  You may help offset this with probiotics which you can buy or get in yogurt. Do not eat or take the probiotics until 2 hours after your antibiotic.    Emergency Department Resource Guide 1) Find a Doctor and Pay Out of Pocket Although you won't have to find out who is covered by your insurance plan, it is a good idea to ask around and get recommendations. You will then need to call the office and see if the doctor you have chosen will accept you as a new patient and what types of options they offer for patients who are self-pay. Some doctors offer discounts or will set up payment plans for their patients who do not have insurance, but you will need to ask so you aren't surprised when you get to your appointment.  2) Contact Your Local Health Department Not all health departments have doctors that can see patients for sick visits, but many do, so it is worth a call to see if yours does. If you don't know where your local health department is, you can check in your phone book. The CDC also has a tool to help you locate your state's health department, and many state websites also have listings of all of their local health departments.  3) Find a Walk-in Clinic If your illness is not likely to be very severe or complicated, you may want to try a walk in clinic. These are popping up all over the country in  pharmacies, drugstores, and shopping centers. They're usually staffed by nurse practitioners or physician assistants that have been trained to treat common illnesses and complaints. They're usually fairly quick and inexpensive. However, if you have serious medical issues or chronic medical problems, these are probably not your best option.  No Primary Care Doctor: - Call Health Connect at  [phone] - they can help you locate a primary care doctor that  accepts your insurance, provides certain services, etc. - Physician Referral Service- [phone]  Chronic Pain Problems: Organization         Address  Phone   Notes  [facility]  [phone] Patients need to be referred by their primary care doctor.   Medication Assistance: Organization         Address  Phone   Notes  [facility] Medication [facility] [address] [place] [place], [place] [address] [phone] --Must be a resident of [facility] -- Must have NO insurance coverage whatsoever (no Medicaid/ Medicare, etc.) -- The pt. MUST have a primary care doctor that directs their care regularly and follows them in the community   [facility]ist  [phone]   [employer]  [phone]    Agencies that provide inexpensive medical care: Organization         Address  Phone   Notes  [facility]  [phone]  [facility]    [phone]   [facility] [place], [place] [address] [phone]   [place]. [address] [place] [phone]   Planned Parenthood    [phone]   [facility]    [phone]   [place] and [place] [address], [place] Phone:  [phone], Fax:  [phone] Hours of Operation:  9 am - 6 pm, M-F.  Also accepts Medicaid/Medicare and self-pay.  [facility] for [place], [address],  [place] Phone: [phone], Fax: [phone]. Hours of Operation:  8:30 am - 5:30 pm, M-F.  Also accepts Medicaid and self-pay.  [facility] [place] [address] [place] Phone: [phone]   [place], [place], [place] [phone], Ext. [phone] Mondays & Thursdays: 7-9 AM.  First 15 patients are seen on a first come, first serve basis.    [place] Providers:  Organization         Address  Phone   Notes  [facility] [address], [place] [phone] Also accepts self-pay patients.  [facility] [address], [place]  [phone]   [place], [address], [place] [phone]   [facility] [address] [place] [phone]   Lucianne Lei [address], [place]   [phone] Only accepts [place] Access [place] patients after they have their name applied to their card.   Self-Pay (no insurance) in [facility]:  Organization         Address  Phone   Notes  Sickle Cell Patients, [facility] [place] [phone]   [facility] [place] [phone]   [facility] [place]  [place], [place], [place] [phone]   [facility]/Dr. Osei-Bonsu  [address], [place] or [address], [place] [phone] Phone number for both [place] and [place] locations is the same.  Urgent Medical and [facility] [address], [place] [phone]   [facility] [address] [place] or [address] [phone]   [facility] [address], [place] [phone], phone; [phone], fax Sees patients 1st and 3rd Saturday of every month.  Must not  qualify for public or private insurance (i.e. Medicaid, Medicare, [place] Health Choice, Veterans' Benefits)  Household income should be no more than 200% of the poverty level The clinic cannot treat you if you are pregnant or think you are pregnant  Sexually transmitted diseases are not treated at the clinic.    Dental Care: Organization         Address  Phone  Notes  [facility] Department of [facility] [phone] Accepts children up to age 37 who are enrolled in [place] or [place]; pregnant women with a Medicaid card; and children who have applied for Medicaid or [place] Health Choice, but were declined, whose parents can pay a reduced fee at time of service.  [facility] Department of [facility]  [address],  [place] [phone] Accepts children up to age 76 who are enrolled in Medicaid or [place] Health Choice; pregnant women with a Medicaid card; and children who have applied for Medicaid or  Health Choice, but were declined, whose parents can pay a reduced fee at time of service.  [place] Adult [facility] Access PRO[facility]  [place] [phone] Patients are seen by appointment only. Walk-ins are not accepted. [place] will see patients 62 years of age and older. Monday - Tuesday (8am-5pm) Most Wednesdays (8:30-5pm) $30 per visit, cash only  [facility] Adult Dental Access PROGRAM  [address], [facility] [phone] Patients are seen by appointment only. Walk-ins are not accepted. [place] will see patients 15 years of age and older. One Wednesday Evening (Monthly: Volunteer Based).  $30 per visit, cash only  [place]  [phone] for adults; Children under age 58, call Graduate Pediatric Dentistry at [phone]. Children aged 16-14, please call [phone] to request a pediatric application.  Dental services are provided  in all areas of dental care including fillings, crowns and bridges, complete and partial dentures, implants, gum treatment, root canals, and extractions. Preventive care is also provided. Treatment is provided to both adults and children. Patients are selected via a lottery and there is often a waiting list.   [facility] [address] [place]  [phone] [email or web address]   [facility] [address] [place], [place] [phone], Ext. [phone] Second and Fourth Thursday of each month, opens at 6:30 AM; Clinic ends at 9 AM.  Patients are seen on a first-come first-served basis, and a limited number are seen during each clinic.   [facility]  [address] Hillard Danker [place], [place] [phone]   Eligibility Requirements You must have lived in [place], [place], or [place] counties for at least the last three months.   You cannot be eligible for state or federal sponsored [employer], including [employer], [place], or [employer].   You generally cannot be eligible for healthcare insurance through your employer.    How to apply: Eligibility screenings are held every Tuesday and Wednesday afternoon from 1:00 pm until 4:00 pm. You do not need an appointment for the interview!  [facility] [address] [employer], [place]   [place]  [place] Department  [place]  [phone]    Behavioral Health Resources in the Community: Intensive Outpatient Programs Organization         Address  Phone  Notes  [place]. [address], [place], [place] [phone]   [facility] Outpatient [address] [place], [place]   ADS: [facility] [address], [place], [place]   [place] [address],  [place], [place] or [phone]   Substance Abuse Resources Organization         Address  Phone  Notes  Alcohol and Drug Services  [phone]   [place]  [phone]   [facility] [place]   Chinita Pester  [phone]   Residential & Outpatient Substance Abuse Program  [phone]   Psychological Services Organization         Address  Phone  Notes  [place]  [place]  [phone]  [phone]   [facility] Mental Health [address], [place] [phone] or [phone]    Mobile Crisis Teams Organization         Address  Phone  Notes  [facility], Mobile Crisis Care Unit  [phone]   [facility]  [address] [place], [place] [phone]   Doristine Locks [address] [place] [phone]    Self-Help/Support Groups Organization         Address  Phone             Notes  [facility]ental Health Assoc. of [place] - variety of support groups  336- I7437963 Call for more information  Narcotics Anonymous (NA), [facility] [address], [employer] [place]  2 meetings at this location   Statistician         Address  Phone  Notes  [facility] [address],    [place]  [phone]   [facility]  [address], [place] [address]64, [place], [place] [phone]   [facility] Treatment Facility [address] [place], [place] [phone] Admissions: 8am-3pm M-F  [facility] [address],    [place], [place] [phone]   [facility] [address] [place], [place], [place] [phone]   [facility] [address],  [place], [place] [phone]   [facility] - Intensive Outpatient [address]ure[address], [place], [place] [phone]   [facility] (Addiction Recovery Care [facility] [address] [place]  [place], [place] [phone] or  [phone]   [facility] ([facility]) [address] [place], [place] [phone] Accepts Medicaid  [facility] [place] [address],  [place] [phone] Substance Abuse/Addiction Treatment   [facility] Organization         Address  Phone  Notes  [facility]  [phone]   Angie Fava, PhD [address] Ervin Knack [place], [place]   [phone] or [phone]   [facility]al   [address] [place], [place] [phone]   [facility] [address], [place], [place] [phone] Insurance/Medicaid/sponsorship through [facility] and Families [address]                                    [place], [place] [phone] Therapy/tele-psych/case  [facility] [address], [place] [phone]    Dr. Lolly Mustache  [phone]   [facility]  [employer] [facility] Dept. 1) [address],  2) [address] [place] 3)  [address], [place] [phone]  [phone]   [facility] Child Abuse Hotline (510)447-9756 or ([phone] (After Hours)

## 2015-03-05 NOTE — ED Notes (Signed)
Pt reports dysuria that started last night, and a smal amount of vaginal discharge.

## 2015-03-05 NOTE — ED Notes (Signed)
Pelvic cart at bedside. 

## 2015-03-05 NOTE — ED Notes (Signed)
MD at bedside. 

## 2015-03-05 NOTE — ED Notes (Signed)
Pt ambulated to restroom. 

## 2015-03-05 NOTE — ED Provider Notes (Signed)
CSN: [id number]     Arrival date & time 03/05/15  0830 History   First MD Initiated Contact with Patient 03/05/15 0901     Chief Complaint  Patient presents with  . Dysuria     (Consider location/radiation/quality/duration/timing/severity/associated sxs/prior Treatment) HPI   Kendra Fields is a 25 y.o. female, with a history of chlamydia and trichomonas, presenting to the ED with dysuria since last night. Pt also endorses possible STD exposure, with last sexual contact two weeks ago. Pt complains of vaginal discharge as well. Pt denies N/V, fever/chills, abdominal pain, other GI symptoms, or any other complaints.      Past Medical History  Diagnosis Date  . Preterm labor   . Pregnancy induced hypertension   . Chlamydia December 2016  . Trichomonas vaginitis December 2016   Past Surgical History  Procedure Laterality Date  . No past surgeries     Family History  Problem Relation Age of Onset  . Alcohol abuse Mother   . Asthma Brother   . Stroke Maternal Uncle   . Cancer Maternal Grandmother     brain  . Diabetes Maternal Grandmother   . Diabetes Paternal Grandmother   . Hypertension Paternal Grandmother    Social History  Substance Use Topics  . Smoking status: Current Every Day Smoker    Types: Cigarettes  . Smokeless tobacco: None  . Alcohol Use: Yes     Comment: occassionally   OB History    Gravida Para Term Preterm AB TAB SAB Ectopic Multiple Living   3 3 0 3  0 0 0 1 2     Review of Systems  Constitutional: Negative for fever and chills.  Gastrointestinal: Negative for nausea, vomiting and abdominal pain.  Genitourinary: Positive for dysuria. Negative for hematuria and flank pain.  All other systems reviewed and are negative.     Allergies  Penicillins  Home Medications   Prior to Admission medications   Medication Sig Start Date End Date Taking? Authorizing Provider  metroNIDAZOLE (FLAGYL) 500 MG tablet Take 1 tablet (500 mg total) by mouth 2  (two) times daily. 03/05/15   Juanita Devincent C Chasta Deshpande, PA-C  nitrofurantoin, macrocrystal-monohydrate, (MACROBID) 100 MG capsule Take 1 capsule (100 mg total) by mouth 2 (two) times daily. 03/05/15   Kamaiya Antilla C Keigan Girten, PA-C  phenazopyridine (PYRIDIUM) 200 MG tablet Take 1 tablet (200 mg total) by mouth 3 (three) times daily. 03/05/15   Meredyth Hornung C Deondrick Searls, PA-C   BP 106/77 mmHg  Pulse 74  Temp(Src) 97.8 F (36.6 C)  Resp 14  Ht  (1.651 m)  Wt 56.246 kg  BMI 20.63 kg/m2  SpO2 95%  LMP 02/26/2015 Physical Exam  Constitutional: She appears well-developed and well-nourished. No distress.  HENT:  Head: Normocephalic and atraumatic.  Eyes: Conjunctivae are normal. Pupils are equal, round, and reactive to light.  Neck: Neck supple.  Cardiovascular: Normal rate, regular rhythm, normal heart sounds and intact distal pulses.   Pulmonary/Chest: Effort normal and breath sounds normal. No respiratory distress.  Abdominal: Soft. Bowel sounds are normal. There is no tenderness. There is no guarding.  Genitourinary:  External genitalia normal Vagina with discharge - moderate, white discharge with green tint Cervix  normal negative for cervical motion tenderness Adnexa palpated, no masses or negative for tenderness noted Bladder palpated negative for tenderness Uterus palpated no masses or negative for tenderness Otherwise normal female genitalia. RN, Leeroy Bock, served as Biomedical engineer during exam.   Musculoskeletal: She exhibits no edema or tenderness.  Lymphadenopathy:    She has no cervical adenopathy.  Neurological: She is alert.  Skin: Skin is warm and dry. She is not diaphoretic.  Nursing note and vitals reviewed.   ED Course  Pelvic exam Date/Time: 03/05/2015 9:31 AM Performed by: Anselm Pancoast Authorized by: Anselm Pancoast Consent: Verbal consent obtained. Risks and benefits: risks, benefits and alternatives were discussed Consent given by: patient Patient understanding: patient states understanding of the  procedure being performed Patient consent: the patient's understanding of the procedure matches consent given Procedure consent: procedure consent matches procedure scheduled Patient identity confirmed: verbally with patient and arm band Local anesthesia used: no Patient sedated: no Patient tolerance: Patient tolerated the procedure well with no immediate complications   (including critical care time) Labs Review Labs Reviewed  WET PREP, GENITAL - Abnormal; Notable for the following:    Trich, Wet Prep PRESENT (*)    Clue Cells Wet Prep HPF POC PRESENT (*)    WBC, Wet Prep HPF POC MANY (*)    All other components within normal limits  URINALYSIS, ROUTINE W REFLEX MICROSCOPIC (NOT AT [facility]) - Abnormal; Notable for the following:    APPearance CLOUDY (*)    Leukocytes, UA MODERATE (*)    All other components within normal limits  URINE MICROSCOPIC-ADD ON - Abnormal; Notable for the following:    Squamous Epithelial / LPF 0-5 (*)    Bacteria, UA MANY (*)    All other components within normal limits  URINE CULTURE  RAPID HIV SCREEN (HIV 1/2 AB+AG)  RPR  POC URINE PREG, ED  GC/CHLAMYDIA PROBE AMP ([place]) NOT AT [facility]    Imaging Review No results found. I have personally reviewed and evaluated these lab results as part of my medical decision-making.   EKG Interpretation None      MDM   Final diagnoses:  Acute cystitis without hematuria  Dysuria  Trichomonas vaginitis  Bacterial vaginosis    [name] presents with complaints of dysuria and possible STD contact.  This patient's symptoms, lab findings, and physical exam findings give evidence for a UTI with or without a STD. Patient treated prophylactically with azithromycin and Rocephin. Patient monitored for a reaction to Rocephin. Patient had no reaction. Patient was also found to have Trichomonas and bacterial vaginosis, for which she will be treated with Flagyl over the course of 7 days. The patient was  given instructions for home care as well as return precautions. Patient voices understanding of these instructions, accepts the plan, and is comfortable with discharge.  Filed Vitals:   03/05/15 0835 03/05/15 1010  BP: 110/78 106/77  Pulse: 75 74  Temp: 97.8 F (36.6 C)   Resp: 18 14  Height:  (1.651 m)   Weight: 56.246 kg   SpO2: 99% 95%     Anselm Pancoast, PA-C 03/05/15 1106  Raeford Razor, MD 03/06/15 [phone]

## 2015-03-06 LAB — GC/CHLAMYDIA PROBE AMP (~~LOC~~) NOT AT ARMC
CHLAMYDIA, DNA PROBE: NEGATIVE
NEISSERIA GONORRHEA: NEGATIVE

## 2015-03-06 LAB — URINE CULTURE

## 2015-04-24 ENCOUNTER — Encounter (HOSPITAL_COMMUNITY): Payer: Self-pay | Admitting: *Deleted

## 2015-04-24 ENCOUNTER — Inpatient Hospital Stay (HOSPITAL_COMMUNITY): Payer: Self-pay

## 2015-04-24 ENCOUNTER — Inpatient Hospital Stay (HOSPITAL_COMMUNITY)
Admission: AD | Admit: 2015-04-24 | Discharge: 2015-04-24 | Disposition: A | Discharge status: Home / Self Care | Payer: Self-pay | Source: Ambulatory Visit | Attending: Family Medicine | Admitting: Family Medicine

## 2015-04-24 DIAGNOSIS — K5900 Constipation, unspecified: Secondary | ICD-10-CM | POA: Insufficient documentation

## 2015-04-24 DIAGNOSIS — R109 Unspecified abdominal pain: Secondary | ICD-10-CM | POA: Insufficient documentation

## 2015-04-24 DIAGNOSIS — O26891 Other specified pregnancy related conditions, first trimester: Secondary | ICD-10-CM | POA: Insufficient documentation

## 2015-04-24 DIAGNOSIS — O99611 Diseases of the digestive system complicating pregnancy, first trimester: Secondary | ICD-10-CM

## 2015-04-24 DIAGNOSIS — O3680X0 Pregnancy with inconclusive fetal viability, not applicable or unspecified: Secondary | ICD-10-CM

## 2015-04-24 DIAGNOSIS — K5901 Slow transit constipation: Secondary | ICD-10-CM

## 2015-04-24 DIAGNOSIS — O26899 Other specified pregnancy related conditions, unspecified trimester: Secondary | ICD-10-CM

## 2015-04-24 DIAGNOSIS — Z3A01 Less than 8 weeks gestation of pregnancy: Secondary | ICD-10-CM | POA: Insufficient documentation

## 2015-04-24 LAB — CBC WITH DIFFERENTIAL/PLATELET
BASOS ABS: 0 10*3/uL (ref 0.0–0.1)
BASOS PCT: 1 %
EOS PCT: 1 %
Eosinophils Absolute: 0.1 10*3/uL (ref 0.0–0.7)
HCT: 33.1 % — ABNORMAL LOW (ref 36.0–46.0)
Hemoglobin: 11.3 g/dL — ABNORMAL LOW (ref 12.0–15.0)
Lymphocytes Relative: 45 %
Lymphs Abs: 3.3 10*3/uL (ref 0.7–4.0)
MCH: 28.6 pg (ref 26.0–34.0)
MCHC: 34.1 g/dL (ref 30.0–36.0)
MCV: 83.8 fL (ref 78.0–100.0)
MONO ABS: 0.3 10*3/uL (ref 0.1–1.0)
Monocytes Relative: 4 %
Neutro Abs: 3.6 10*3/uL (ref 1.7–7.7)
Neutrophils Relative %: 49 %
PLATELETS: 313 10*3/uL (ref 150–400)
RBC: 3.95 MIL/uL (ref 3.87–5.11)
RDW: 14 % (ref 11.5–15.5)
WBC: 7.2 10*3/uL (ref 4.0–10.5)

## 2015-04-24 LAB — URINE MICROSCOPIC-ADD ON

## 2015-04-24 LAB — WET PREP, GENITAL
Clue Cells Wet Prep HPF POC: NONE SEEN
Sperm: NONE SEEN
TRICH WET PREP: NONE SEEN
YEAST WET PREP: NONE SEEN

## 2015-04-24 LAB — URINALYSIS, ROUTINE W REFLEX MICROSCOPIC
BILIRUBIN URINE: NEGATIVE
Glucose, UA: NEGATIVE mg/dL
KETONES UR: NEGATIVE mg/dL
Nitrite: NEGATIVE
PROTEIN: NEGATIVE mg/dL
Specific Gravity, Urine: 1.01 (ref 1.005–1.030)
pH: 5.5 (ref 5.0–8.0)

## 2015-04-24 LAB — HCG, QUANTITATIVE, PREGNANCY: hCG, Beta Chain, Quant, S: 5685 m[IU]/mL — ABNORMAL HIGH (ref <5)

## 2015-04-24 LAB — POCT PREGNANCY, URINE: PREG TEST UR: POSITIVE — AB

## 2015-04-24 NOTE — MAU Note (Signed)
PT  SAYS SHE HAS LOWER ABD  PAIN -  STARTED ON   3-23.   LAST SEX-   2 WEEKS AGO .  NO BIRTH  CONTROL.  NO HPT.      NO BLEEDING.    HAS WHITE THICK ITCHING  D/C.   - STARTED 2-3  DAYS  AGO.

## 2015-04-24 NOTE — MAU Provider Note (Signed)
History     CSN: [id number]  Arrival date and time: 04/24/15 2012   First Provider Initiated Contact with Patient 04/24/15 2040      Chief Complaint  Patient presents with  . Abdominal Pain   HPI [name] is a 25 y.o. [phone] at [redacted]w[redacted]d who presents to MAU today with complaint of abdominal pain. The patient has not taken HPT. She states LLQ abdominal pain since last Thursday. LMP 03/24/15. She denies vaginal bleeding, recent intercourse, fever, N/V/D today. She has had mild constipation. She rates pain at 7/10 now. She has not taken anything for pain. She does endorse a thick, white discharge with associated itching x 2-3 days.   OB History    Gravida Para Term Preterm AB TAB SAB Ectopic Multiple Living   4 3 0 3  0 0 0 1 2      Past Medical History  Diagnosis Date  . Preterm labor   . Pregnancy induced hypertension   . Chlamydia December 2016  . Trichomonas vaginitis December 2016    Past Surgical History  Procedure Laterality Date  . No past surgeries      Family History  Problem Relation Age of Onset  . Alcohol abuse Mother   . Asthma Brother   . Stroke Maternal Uncle   . Cancer Maternal Grandmother     brain  . Diabetes Maternal Grandmother   . Diabetes Paternal Grandmother   . Hypertension Paternal Grandmother     Social History  Substance Use Topics  . Smoking status: Current Every Day Smoker    Types: Cigarettes  . Smokeless tobacco: None  . Alcohol Use: Yes     Comment: occassionally    Allergies:  Allergies  Allergen Reactions  . Penicillins Other (See Comments)    Childhood reaction---unknown    Prescriptions prior to admission  Medication Sig Dispense Refill Last Dose  . metroNIDAZOLE (FLAGYL) 500 MG tablet Take 1 tablet (500 mg total) by mouth 2 (two) times daily. 14 tablet 0   . nitrofurantoin, macrocrystal-monohydrate, (MACROBID) 100 MG capsule Take 1 capsule (100 mg total) by mouth 2 (two) times daily. 10 capsule 0   .  phenazopyridine (PYRIDIUM) 200 MG tablet Take 1 tablet (200 mg total) by mouth 3 (three) times daily. 15 tablet 0     Review of Systems  Constitutional: Negative for fever and malaise/fatigue.  Gastrointestinal: Positive for nausea, abdominal pain and constipation. Negative for vomiting and diarrhea.  Genitourinary: Negative for dysuria, urgency and frequency.       + vaginal discharge Neg - vaginal bleeding   Physical Exam   Blood pressure 106/69, pulse 89, temperature 98.2 F (36.8 C), temperature source Oral, resp. rate 18, height  (1.626 m), weight 122 lb 8 oz (55.566 kg), last menstrual period 03/24/2015.  Physical Exam  Nursing note and vitals reviewed. Constitutional: She is oriented to person, place, and time. She appears well-developed and well-nourished. No distress.  HENT:  Head: Normocephalic and atraumatic.  Cardiovascular: Normal rate.   Respiratory: Effort normal.  GI: Soft. She exhibits no distension and no mass. There is tenderness (mild lower abdominal tenderness to palpation worse on left than right). There is no rebound and no guarding.  Neurological: She is alert and oriented to person, place, and time.  Skin: Skin is warm and dry. No erythema.  Psychiatric: She has a normal mood and affect.   Results for orders placed or performed during the hospital encounter of 04/24/15 (from  the past 24 hour(s))  Urinalysis, Routine w reflex microscopic (not at [facility])     Status: Abnormal   Collection Time: 04/24/15  8:40 PM  Result Value Ref Range   Color, Urine YELLOW YELLOW   APPearance CLEAR CLEAR   Specific Gravity, Urine 1.010 1.005 - 1.030   pH 5.5 5.0 - 8.0   Glucose, UA NEGATIVE NEGATIVE mg/dL   Hgb urine dipstick TRACE (A) NEGATIVE   Bilirubin Urine NEGATIVE NEGATIVE   Ketones, ur NEGATIVE NEGATIVE mg/dL   Protein, ur NEGATIVE NEGATIVE mg/dL   Nitrite NEGATIVE NEGATIVE   Leukocytes, UA MODERATE (A) NEGATIVE  Urine microscopic-add on     Status: Abnormal    Collection Time: 04/24/15  8:40 PM  Result Value Ref Range   Squamous Epithelial / LPF 0-5 (A) NONE SEEN   WBC, UA 6-30 0 - 5 WBC/hpf   RBC / HPF 0-5 0 - 5 RBC/hpf   Bacteria, UA RARE (A) NONE SEEN  Pregnancy, urine POC     Status: Abnormal   Collection Time: 04/24/15  9:32 PM  Result Value Ref Range   Preg Test, Ur POSITIVE (A) NEGATIVE  CBC with Differential/Platelet     Status: Abnormal   Collection Time: 04/24/15  9:57 PM  Result Value Ref Range   WBC 7.2 4.0 - 10.5 K/uL   RBC 3.95 3.87 - 5.11 MIL/uL   Hemoglobin 11.3 (L) 12.0 - 15.0 g/dL   HCT 16.1 (L) 09.6 - 04.5 %   MCV 83.8 78.0 - 100.0 fL   MCH 28.6 26.0 - 34.0 pg   MCHC 34.1 30.0 - 36.0 g/dL   RDW 40.9 81.1 - 91.4 %   Platelets 313 150 - 400 K/uL   Neutrophils Relative % 49 %   Neutro Abs 3.6 1.7 - 7.7 K/uL   Lymphocytes Relative 45 %   Lymphs Abs 3.3 0.7 - 4.0 K/uL   Monocytes Relative 4 %   Monocytes Absolute 0.3 0.1 - 1.0 K/uL   Eosinophils Relative 1 %   Eosinophils Absolute 0.1 0.0 - 0.7 K/uL   Basophils Relative 1 %   Basophils Absolute 0.0 0.0 - 0.1 K/uL  hCG, quantitative, pregnancy     Status: Abnormal   Collection Time: 04/24/15  9:57 PM  Result Value Ref Range   hCG, Beta Chain, Quant, S 5685 (H) <5 mIU/mL  Wet prep, genital     Status: Abnormal   Collection Time: 04/24/15 11:33 PM  Result Value Ref Range   Yeast Wet Prep HPF POC NONE SEEN NONE SEEN   Trich, Wet Prep NONE SEEN NONE SEEN   Clue Cells Wet Prep HPF POC NONE SEEN NONE SEEN   WBC, Wet Prep HPF POC FEW (A) NONE SEEN   Sperm NONE SEEN    US Ob Comp Less 14 Wks  04/24/2015  CLINICAL DATA:  25 year old female with left lower quadrant abdominal pain EXAM: OBSTETRIC <14 WK [place] AND TRANSVAGINAL OB US TECHNIQUE: Both transabdominal and transvaginal ultrasound examinations were performed for complete evaluation of the gestation as well as the maternal uterus, adnexal regions, and pelvic cul-de-sac. Transvaginal technique was performed to assess  early pregnancy. COMPARISON:  None. FINDINGS: The uterus is anteverted. Two sac-like structure noted within the uterus with surrounding decidual reaction. A faint curvilinear echogenic structure within are Hill-Sachs may represent an early yolk sac. No definite yolk sac identified in the second sac. The mean sac diameters are 5 mm and 4 mm. The estimated gestational age based on  mean sac diameter is 5 weeks, 2 days and 5 weeks, 1 day respectively. No fetal pole identified within either sac. There is a small subarachnoid hemorrhage measuring 4 x 7 mm. There are 2 complex structure in the region of the right adnexa measuring 1.3 x 4.3 x 1.3 cm and 1.7 x 1.3 x 1.4 cm likely representing corpus luteum. Ectopic pregnancy is less likely. Correlation with clinical exam and serial HCG levels and close follow-up with ultrasound recommended. The left ovary is unremarkable and measures 2.3 x 1.4 x 1.4 cm. Trace free fluid noted within the pelvis. IMPRESSION: Two early intrauterine gestational sacs, one appears to contain an early yolk sac. No fetal pole identified at this time. Close follow-up recommended. Small subchorionic hemorrhage. Right ovarian probable corpus luteum. Electronically Signed   By: Elgie CollardArash  Radparvar M.D.   On: 04/24/2015 22:56   [place] Ob Transvaginal  04/24/2015  CLINICAL DATA:  25 year old female with left lower quadrant abdominal pain EXAM: OBSTETRIC <14 WK US AND TRANSVAGINAL OB US TECHNIQUE: Both transabdominal and transvaginal ultrasound examinations were performed for complete evaluation of the gestation as well as the maternal uterus, adnexal regions, and pelvic cul-de-sac. Transvaginal technique was performed to assess early pregnancy. COMPARISON:  None. FINDINGS: The uterus is anteverted. Two sac-like structure noted within the uterus with surrounding decidual reaction. A faint curvilinear echogenic structure within are Hill-Sachs may represent an early yolk sac. No definite yolk sac identified in  the second sac. The mean sac diameters are 5 mm and 4 mm. The estimated gestational age based on mean sac diameter is 5 weeks, 2 days and 5 weeks, 1 day respectively. No fetal pole identified within either sac. There is a small subarachnoid hemorrhage measuring 4 x 7 mm. There are 2 complex structure in the region of the right adnexa measuring 1.3 x 4.3 x 1.3 cm and 1.7 x 1.3 x 1.4 cm likely representing corpus luteum. Ectopic pregnancy is less likely. Correlation with clinical exam and serial HCG levels and close follow-up with ultrasound recommended. The left ovary is unremarkable and measures 2.3 x 1.4 x 1.4 cm. Trace free fluid noted within the pelvis. IMPRESSION: Two early intrauterine gestational sacs, one appears to contain an early yolk sac. No fetal pole identified at this time. Close follow-up recommended. Small subchorionic hemorrhage. Right ovarian probable corpus luteum. Electronically Signed   By: Elgie CollardArash  Radparvar M.D.   On: 04/24/2015 22:56    MAU Course  Procedures None  MDM +UPT UA, wet prep, GC/chlamydia, CBC, quant hCG, HIV, RPR and US today to rule out ectopic pregnancy  Assessment and Plan  A: 2 IUGS and one possible YS noted Abdominal pain in pregnancy Pregnancy of unknown location Constipation   P: Discharge home Ectopic precautions discussed Patient advised to follow-up with [facility] on Friday at 8:00 am for repeat labs Patient may return to MAU as needed or if her condition were to change or worsen  Marny LowensteinJulie N Jamita Mckelvin, PA-C  04/24/2015, 11:49 PM

## 2015-04-24 NOTE — Discharge Instructions (Signed)

## 2015-04-25 LAB — HIV ANTIBODY (ROUTINE TESTING W REFLEX): HIV Screen 4th Generation wRfx: NONREACTIVE

## 2015-04-25 LAB — RPR: RPR Ser Ql: NONREACTIVE

## 2015-04-26 LAB — GC/CHLAMYDIA PROBE AMP (~~LOC~~) NOT AT ARMC
Chlamydia: NEGATIVE
Neisseria Gonorrhea: NEGATIVE

## 2015-04-27 ENCOUNTER — Telehealth: Payer: Self-pay

## 2015-04-27 ENCOUNTER — Ambulatory Visit: Payer: Self-pay | Admitting: Obstetrics & Gynecology

## 2015-04-27 ENCOUNTER — Encounter (HOSPITAL_COMMUNITY): Payer: Self-pay | Admitting: Student

## 2015-04-27 ENCOUNTER — Inpatient Hospital Stay (HOSPITAL_COMMUNITY)
Admission: AD | Admit: 2015-04-27 | Discharge: 2015-04-27 | Disposition: A | Discharge status: Home / Self Care | Payer: Self-pay | Source: Ambulatory Visit | Attending: Obstetrics and Gynecology | Admitting: Obstetrics and Gynecology

## 2015-04-27 DIAGNOSIS — O9989 Other specified diseases and conditions complicating pregnancy, childbirth and the puerperium: Secondary | ICD-10-CM

## 2015-04-27 DIAGNOSIS — Z3A01 Less than 8 weeks gestation of pregnancy: Secondary | ICD-10-CM | POA: Insufficient documentation

## 2015-04-27 DIAGNOSIS — R109 Unspecified abdominal pain: Secondary | ICD-10-CM

## 2015-04-27 DIAGNOSIS — O26899 Other specified pregnancy related conditions, unspecified trimester: Secondary | ICD-10-CM

## 2015-04-27 DIAGNOSIS — O26891 Other specified pregnancy related conditions, first trimester: Secondary | ICD-10-CM | POA: Insufficient documentation

## 2015-04-27 DIAGNOSIS — F17210 Nicotine dependence, cigarettes, uncomplicated: Secondary | ICD-10-CM | POA: Insufficient documentation

## 2015-04-27 DIAGNOSIS — O99331 Smoking (tobacco) complicating pregnancy, first trimester: Secondary | ICD-10-CM | POA: Insufficient documentation

## 2015-04-27 DIAGNOSIS — Z880 Allergy status to penicillin: Secondary | ICD-10-CM | POA: Insufficient documentation

## 2015-04-27 LAB — HCG, QUANTITATIVE, PREGNANCY: hCG, Beta Chain, Quant, S: 12587 m[IU]/mL — ABNORMAL HIGH (ref <5)

## 2015-04-27 NOTE — MAU Note (Signed)
Judeth HornErin Lawrence NP in Triage to discuss lab results and d/c plan with pt. Pt d/c home from Triage

## 2015-04-27 NOTE — Telephone Encounter (Signed)
Called pt in regards to missed STAT beta draw.  Pt stated that she was going to call and reschedule but just has not had a chance to.  I advised pt to go to MAU for the lab draw because the providers wanted to monitor her.  Pt stated that she will be coming to MAU around 1-130pm.

## 2015-04-27 NOTE — Discharge Instructions (Signed)
First Trimester of Pregnancy °The first trimester of pregnancy is from week 1 until the end of week 12 (months 1 through 3). A week after a sperm fertilizes an egg, the egg will implant on the wall of the uterus. This embryo will begin to develop into a baby. Genes from you and your partner are forming the baby. The female genes determine whether the baby is a boy or a girl. At 6-8 weeks, the eyes and face are formed, and the heartbeat can be seen on ultrasound. At the end of 12 weeks, all the baby's organs are formed.  °Now that you are pregnant, you will want to do everything you can to have a healthy baby. Two of the most important things are to get good prenatal care and to follow your health care provider's instructions. Prenatal care is all the medical care you receive before the baby's birth. This care will help prevent, find, and treat any problems during the pregnancy and childbirth. °BODY CHANGES °Your body goes through many changes during pregnancy. The changes vary from woman to woman.  °· You may gain or lose a couple of pounds at first. °· You may feel sick to your stomach (nauseous) and throw up (vomit). If the vomiting is uncontrollable, call your health care provider. °· You may tire easily. °· You may develop headaches that can be relieved by medicines approved by your health care provider. °· You may urinate more often. Painful urination may mean you have a bladder infection. °· You may develop heartburn as a result of your pregnancy. °· You may develop constipation because certain hormones are causing the muscles that push waste through your intestines to slow down. °· You may develop hemorrhoids or swollen, bulging veins (varicose veins). °· Your breasts may begin to grow larger and become tender. Your nipples may stick out more, and the tissue that surrounds them (areola) may become darker. °· Your gums may bleed and may be sensitive to brushing and flossing. °· Dark spots or blotches (chloasma,  mask of pregnancy) may develop on your face. This will likely fade after the baby is born. °· Your menstrual periods will stop. °· You may have a loss of appetite. °· You may develop cravings for certain kinds of food. °· You may have changes in your emotions from day to day, such as being excited to be pregnant or being concerned that something may go wrong with the pregnancy and baby. °· You may have more vivid and strange dreams. °· You may have changes in your hair. These can include thickening of your hair, rapid growth, and changes in texture. Some women also have hair loss during or after pregnancy, or hair that feels dry or thin. Your hair will most likely return to normal after your baby is born. °WHAT TO EXPECT AT YOUR PRENATAL VISITS °During a routine prenatal visit: °· You will be weighed to make sure you and the baby are growing normally. °· Your blood pressure will be taken. °· Your abdomen will be measured to track your baby's growth. °· The fetal heartbeat will be listened to starting around week 10 or 12 of your pregnancy. °· Test results from any previous visits will be discussed. °Your health care provider may ask you: °· How you are feeling. °· If you are feeling the baby move. °· If you have had any abnormal symptoms, such as leaking fluid, bleeding, severe headaches, or abdominal cramping. °· If you are using any tobacco products,   including cigarettes, chewing tobacco, and electronic cigarettes. °· If you have any questions. °Other tests that may be performed during your first trimester include: °· Blood tests to find your blood type and to check for the presence of any previous infections. They will also be used to check for low iron levels (anemia) and Rh antibodies. Later in the pregnancy, blood tests for diabetes will be done along with other tests if problems develop. °· Urine tests to check for infections, diabetes, or protein in the urine. °· An ultrasound to confirm the proper growth  and development of the baby. °· An amniocentesis to check for possible genetic problems. °· Fetal screens for spina bifida and Down syndrome. °· You may need other tests to make sure you and the baby are doing well. °· HIV (human immunodeficiency virus) testing. Routine prenatal testing includes screening for HIV, unless you choose not to have this test. °HOME CARE INSTRUCTIONS  °Medicines °· Follow your health care provider's instructions regarding medicine use. Specific medicines may be either safe or unsafe to take during pregnancy. °· Take your prenatal vitamins as directed. °· If you develop constipation, try taking a stool softener if your health care provider approves. °Diet °· Eat regular, well-balanced meals. Choose a variety of foods, such as meat or vegetable-based protein, fish, milk and low-fat dairy products, vegetables, fruits, and whole grain breads and cereals. Your health care provider will help you determine the amount of weight gain that is right for you. °· Avoid raw meat and uncooked cheese. These carry germs that can cause birth defects in the baby. °· Eating four or five small meals rather than three large meals a day may help relieve nausea and vomiting. If you start to feel nauseous, eating a few soda crackers can be helpful. Drinking liquids between meals instead of during meals also seems to help nausea and vomiting. °· If you develop constipation, eat more high-fiber foods, such as fresh vegetables or fruit and whole grains. Drink enough fluids to keep your urine clear or pale yellow. °Activity and Exercise °· Exercise only as directed by your health care provider. Exercising will help you: °¨ Control your weight. °¨ Stay in shape. °¨ Be prepared for labor and delivery. °· Experiencing pain or cramping in the lower abdomen or low back is a good sign that you should stop exercising. Check with your health care provider before continuing normal exercises. °· Try to avoid standing for long  periods of time. Move your legs often if you must stand in one place for a long time. °· Avoid heavy lifting. °· Wear low-heeled shoes, and practice good posture. °· You may continue to have sex unless your health care provider directs you otherwise. °Relief of Pain or Discomfort °· Wear a good support bra for breast tenderness.   °· Take warm sitz baths to soothe any pain or discomfort caused by hemorrhoids. Use hemorrhoid cream if your health care provider approves.   °· Rest with your legs elevated if you have leg cramps or low back pain. °· If you develop varicose veins in your legs, wear support hose. Elevate your feet for 15 minutes, 3-4 times a day. Limit salt in your diet. °Prenatal Care °· Schedule your prenatal visits by the twelfth week of pregnancy. They are usually scheduled monthly at first, then more often in the last 2 months before delivery. °· Write down your questions. Take them to your prenatal visits. °· Keep all your prenatal visits as directed by your   health care provider. °Safety °· Wear your seat belt at all times when driving. °· Make a list of emergency phone numbers, including numbers for family, friends, the hospital, and police and fire departments. °General Tips °· Ask your health care provider for a referral to a local prenatal education class. Begin classes no later than at the beginning of month 6 of your pregnancy. °· Ask for help if you have counseling or nutritional needs during pregnancy. Your health care provider can offer advice or refer you to specialists for help with various needs. °· Do not use hot tubs, steam rooms, or saunas. °· Do not douche or use tampons or scented sanitary pads. °· Do not cross your legs for long periods of time. °· Avoid cat litter boxes and soil used by cats. These carry germs that can cause birth defects in the baby and possibly loss of the fetus by miscarriage or stillbirth. °· Avoid all smoking, herbs, alcohol, and medicines not prescribed by  your health care provider. Chemicals in these affect the formation and growth of the baby. °· Do not use any tobacco products, including cigarettes, chewing tobacco, and electronic cigarettes. If you need help quitting, ask your health care provider. You may receive counseling support and other resources to help you quit. °· Schedule a dentist appointment. At home, brush your teeth with a soft toothbrush and be gentle when you floss. °SEEK MEDICAL CARE IF:  °· You have dizziness. °· You have mild pelvic cramps, pelvic pressure, or nagging pain in the abdominal area. °· You have persistent nausea, vomiting, or diarrhea. °· You have a bad smelling vaginal discharge. °· You have pain with urination. °· You notice increased swelling in your face, hands, legs, or ankles. °SEEK IMMEDIATE MEDICAL CARE IF:  °· You have a fever. °· You are leaking fluid from your vagina. °· You have spotting or bleeding from your vagina. °· You have severe abdominal cramping or pain. °· You have rapid weight gain or loss. °· You vomit blood or material that looks like coffee grounds. °· You are exposed to German measles and have never had them. °· You are exposed to fifth disease or chickenpox. °· You develop a severe headache. °· You have shortness of breath. °· You have any kind of trauma, such as from a fall or a car accident. °  °This information is not intended to replace advice given to you by your health care provider. Make sure you discuss any questions you have with your health care provider. °  °Document Released: 01/07/2001 Document Revised: 02/03/2014 Document Reviewed: 11/23/2012 °Elsevier Interactive Patient Education ©2016 [employer] ° ° ° °Abdominal Pain During Pregnancy °Abdominal pain is common in pregnancy. Most of the time, it does not cause harm. There are many causes of abdominal pain. Some causes are more serious than others. Some of the causes of abdominal pain in pregnancy are easily diagnosed. Occasionally, the  diagnosis takes time to understand. Other times, the cause is not determined. Abdominal pain can be a sign that something is very wrong with the pregnancy, or the pain may have nothing to do with the pregnancy at all. For this reason, always tell your health care provider if you have any abdominal discomfort. °HOME CARE INSTRUCTIONS  °Monitor your abdominal pain for any changes. The following actions may help to alleviate any discomfort you are experiencing: °· Do not have sexual intercourse or put anything in your vagina until your symptoms go away completely. °· Get   plenty of rest until your pain improves. °· Drink clear fluids if you feel nauseous. Avoid solid food as long as you are uncomfortable or nauseous. °· Only take over-the-counter or prescription medicine as directed by your health care provider. °· Keep all follow-up appointments with your health care provider. °SEEK IMMEDIATE MEDICAL CARE IF: °· You are bleeding, leaking fluid, or passing tissue from the vagina. °· You have increasing pain or cramping. °· You have persistent vomiting. °· You have painful or bloody urination. °· You have a fever. °· You notice a decrease in your baby's movements. °· You have extreme weakness or feel faint. °· You have shortness of breath, with or without abdominal pain. °· You develop a severe headache with abdominal pain. °· You have abnormal vaginal discharge with abdominal pain. °· You have persistent diarrhea. °· You have abdominal pain that continues even after rest, or gets worse. °MAKE SURE YOU:  °· Understand these instructions. °· Will watch your condition. °· Will get help right away if you are not doing well or get worse. °  °This information is not intended to replace advice given to you by your health care provider. Make sure you discuss any questions you have with your health care provider. °  °Document Released: 01/13/2005 Document Revised: 11/03/2012 Document Reviewed: 08/12/2012 °Elsevier Interactive  Patient Education ©2016 [employer] Inc. ° °

## 2015-04-27 NOTE — MAU Provider Note (Signed)
History   [id number]   Chief Complaint  Patient presents with  . Follow-up    HPI Kendra Fields is a 25 y.o. female [phone] here for follow-up BHCG.  Upon review of the records patient was first seen on 3/28 for abdominal pain. BHCG on that day was 5685. Ultrasound showed 2 possible IUGS, 1 with probable yolk sac & 1 without. GC/CT and wet prep were collected. Results were negative. Pt discharged home to follow up in clinic for repeat BHCG; patient missed that appt this morning. Pt here today with no report of abdominal pain or vaginal bleeding. All other systems negative.    Patient's last menstrual period was 03/24/2015.  OB History  Gravida Para Term Preterm AB SAB TAB Ectopic Multiple Living  4 3 0 3  0 0 0 1 2    # Outcome Date GA Lbr Len/2nd Weight Sex Delivery Anes PTL Lv  4 Current           3A Preterm      Vag-Spont   Y     Comments: twins, 29wks  3B Preterm      Vag-Spont   Y  2 Preterm      Vag-Spont   FD     Comments: 30 wk IUFD. placental abruption  1 Preterm      Vag-Spont   FD     Comments: IUFD at 30 weeks      Past Medical History  Diagnosis Date  . Preterm labor   . Pregnancy induced hypertension   . Chlamydia December 2016  . Trichomonas vaginitis December 2016    Family History  Problem Relation Age of Onset  . Alcohol abuse Mother   . Asthma Brother   . Stroke Maternal Uncle   . Cancer Maternal Grandmother     brain  . Diabetes Maternal Grandmother   . Diabetes Paternal Grandmother   . Hypertension Paternal Grandmother     Social History   Social History  . Marital Status: Single    Spouse Name: N/A  . Number of Children: N/A  . Years of Education: N/A   Social History Main Topics  . Smoking status: Current Every Day Smoker    Types: Cigarettes  . Smokeless tobacco: Not on file  . Alcohol Use: Yes     Comment: occassionally  . Drug Use: No  . Sexual Activity: Yes    Birth Control/ Protection: None   Other Topics Concern  .  Not on file   Social History Narrative    Allergies  Allergen Reactions  . Penicillins Other (See Comments)    Childhood reaction---unknown    No current facility-administered medications on file prior to encounter.   Current Outpatient Prescriptions on File Prior to Encounter  Medication Sig Dispense Refill  . [DISCONTINUED] sodium chloride (OCEAN) 0.65 % SOLN nasal spray Place 1 spray into both nostrils as needed. (Patient not taking: Reported on 06/02/2014) 1 Bottle 0     Physical Exam   Filed Vitals:   04/27/15 2013  BP: 102/70  Pulse: 82  Temp: 98.2 F (36.8 C)  Resp: 18  Height: 5\' 6"  (1.676 m)  Weight: 124 lb 6.4 oz (56.427 kg)    Physical Exam  Constitutional: She is oriented to person, place, and time. She appears well-developed and well-nourished. No distress.  HENT:  Head: Normocephalic and atraumatic.  Respiratory: Effort normal. No respiratory distress.  Musculoskeletal: Normal range of motion.  Neurological: She is alert and oriented to  person, place, and time.  Skin: She is not diaphoretic.  Psychiatric: She has a normal mood and affect. Her behavior is normal. Judgment and thought content normal.    MAU Course  Procedures Component     Latest Ref Rng 04/24/2015 04/27/2015  HCG, Beta Chain, Quant, S     <5 mIU/mL 5685 (H) 12587 (H)   MDM Pt denies abdominal pain or vaginal bleeding Appropriate rise in BHCG Will order outpatient ultrasound for viability  Assessment and Plan  25 y.o. [id number] at [redacted]w[redacted]d wks Pregnancy Follow-up BHCG Pregnancy of Unknown Location  P: Discharge home Discussed reasons to return to MAU Outpatient ultrasound ordered Msg sent to clinic for f/u after ultrasound  Judeth Horn, NP 04/27/2015 9:28 PM

## 2015-04-27 NOTE — MAU Note (Signed)
For repeat BHCG. Denies any problems.

## 2015-05-07 ENCOUNTER — Encounter: Payer: Self-pay | Admitting: Family Medicine

## 2015-05-07 ENCOUNTER — Ambulatory Visit (HOSPITAL_COMMUNITY)
Admission: RE | Admit: 2015-05-07 | Discharge: 2015-05-07 | Disposition: A | Discharge status: Home / Self Care | Payer: Self-pay | Source: Ambulatory Visit | Attending: Student | Admitting: Student

## 2015-05-07 ENCOUNTER — Ambulatory Visit (INDEPENDENT_AMBULATORY_CARE_PROVIDER_SITE_OTHER): Payer: Self-pay | Admitting: Obstetrics and Gynecology

## 2015-05-07 DIAGNOSIS — R109 Unspecified abdominal pain: Secondary | ICD-10-CM

## 2015-05-07 DIAGNOSIS — Z36 Encounter for antenatal screening of mother: Secondary | ICD-10-CM | POA: Insufficient documentation

## 2015-05-07 DIAGNOSIS — O30049 Twin pregnancy, dichorionic/diamniotic, unspecified trimester: Secondary | ICD-10-CM | POA: Insufficient documentation

## 2015-05-07 DIAGNOSIS — O30001 Twin pregnancy, unspecified number of placenta and unspecified number of amniotic sacs, first trimester: Secondary | ICD-10-CM | POA: Insufficient documentation

## 2015-05-07 DIAGNOSIS — N8311 Corpus luteum cyst of right ovary: Secondary | ICD-10-CM | POA: Insufficient documentation

## 2015-05-07 DIAGNOSIS — O30041 Twin pregnancy, dichorionic/diamniotic, first trimester: Secondary | ICD-10-CM

## 2015-05-07 DIAGNOSIS — O26899 Other specified pregnancy related conditions, unspecified trimester: Secondary | ICD-10-CM

## 2015-05-07 DIAGNOSIS — O3481 Maternal care for other abnormalities of pelvic organs, first trimester: Secondary | ICD-10-CM | POA: Insufficient documentation

## 2015-05-07 DIAGNOSIS — Z3A01 Less than 8 weeks gestation of pregnancy: Secondary | ICD-10-CM | POA: Insufficient documentation

## 2015-05-07 MED ORDER — PRENATAL VITAMINS 0.8 MG PO TABS: 1.0000 | ORAL_TABLET | Freq: Every day | ORAL | Status: DC

## 2015-05-07 NOTE — Progress Notes (Signed)
CLINIC ENCOUNTER NOTE  History:  25 y.o. [phone] here today for f/u u/s.  Recent mau visits for pain. U/s today confirms di-di twin gestation. Currently no pain, no bleeding.  Past Medical History  Diagnosis Date  . Preterm labor   . Pregnancy induced hypertension   . Chlamydia December 2016  . Trichomonas vaginitis December 2016    Past Surgical History  Procedure Laterality Date  . No past surgeries      The following portions of the patient's history were reviewed and updated as appropriate: allergies, current medications, past family history, past medical history, past social history, past surgical history and problem list.    Review of Systems:  See above; comprehensive review of systems was otherwise negative.  Objective:  Physical Exam LMP 03/24/2015 CONSTITUTIONAL: Well-developed, well-nourished female in no acute distress.  HENT:  Normocephalic, atraumatic SKIN: Skin is warm and dry.  NEUROLGIC: Alert  PSYCHIATRIC: Normal mood and affect.  CARDIOVASCULAR: Normal heart rate noted RESPIRATORY: Effort and breath sounds normal, no problems with respiration noted    Labs and Imaging [place] Ob Comp Less 14 Wks  04/24/2015  CLINICAL DATA:  25 year old female with left lower quadrant abdominal pain EXAM: OBSTETRIC <14 WK US AND TRANSVAGINAL OB US TECHNIQUE: Both transabdominal and transvaginal ultrasound examinations were performed for complete evaluation of the gestation as well as the maternal uterus, adnexal regions, and pelvic cul-de-sac. Transvaginal technique was performed to assess early pregnancy. COMPARISON:  None. FINDINGS: The uterus is anteverted. Two sac-like structure noted within the uterus with surrounding decidual reaction. A faint curvilinear echogenic structure within are Hill-Sachs may represent an early yolk sac. No definite yolk sac identified in the second sac. The mean sac diameters are 5 mm and 4 mm. The estimated gestational age based on mean sac  diameter is 5 weeks, 2 days and 5 weeks, 1 day respectively. No fetal pole identified within either sac. There is a small subarachnoid hemorrhage measuring 4 x 7 mm. There are 2 complex structure in the region of the right adnexa measuring 1.3 x 4.3 x 1.3 cm and 1.7 x 1.3 x 1.4 cm likely representing corpus luteum. Ectopic pregnancy is less likely. Correlation with clinical exam and serial HCG levels and close follow-up with ultrasound recommended. The left ovary is unremarkable and measures 2.3 x 1.4 x 1.4 cm. Trace free fluid noted within the pelvis. IMPRESSION: Two early intrauterine gestational sacs, one appears to contain an early yolk sac. No fetal pole identified at this time. Close follow-up recommended. Small subchorionic hemorrhage. Right ovarian probable corpus luteum. Electronically Signed   By: Elgie CollardArash  Radparvar M.D.   On: 04/24/2015 22:56   [place] Ob Transvaginal  05/07/2015  CLINICAL DATA:  Follow-up viability, pain EXAM: TWIN OBSTETRICAL ULTRASOUND <14 WKS COMPARISON:  04/24/2015 FINDINGS: Number of IUPs:  2 Chorionicity/Amnionicity:  Dichorionic-diamniotic (thick membrane) TWIN 1 Yolk sac:  Present Embryo:  Present Cardiac Activity: Present Heart Rate: 117 bpm CRL:   4.3  mm   6 w 1 d TWIN 2 Yolk sac:  Present Embryo:  Present Cardiac Activity: Present Heart Rate: 112 bpm CRL:   6.0  mm   6 w 3 d                  US EDC: 12/30/2015 Maternal uterus/adnexae: Small subchronic hemorrhage. Bilateral ovaries are within normal limits, noting to corpus luteal cysts in the right ovary. Small volume pelvic ascites. IMPRESSION: Twin live intrauterine gestations, measuring up to 6 weeks 3 days  by crown-rump length, as above. Electronically Signed   By: Charline Bills M.D.   On: 05/07/2015 11:38   US Ob Transvaginal  04/24/2015  CLINICAL DATA:  25 year old female with left lower quadrant abdominal pain EXAM: OBSTETRIC <14 WK [place] AND TRANSVAGINAL OB US TECHNIQUE: Both transabdominal and transvaginal ultrasound  examinations were performed for complete evaluation of the gestation as well as the maternal uterus, adnexal regions, and pelvic cul-de-sac. Transvaginal technique was performed to assess early pregnancy. COMPARISON:  None. FINDINGS: The uterus is anteverted. Two sac-like structure noted within the uterus with surrounding decidual reaction. A faint curvilinear echogenic structure within are Hill-Sachs may represent an early yolk sac. No definite yolk sac identified in the second sac. The mean sac diameters are 5 mm and 4 mm. The estimated gestational age based on mean sac diameter is 5 weeks, 2 days and 5 weeks, 1 day respectively. No fetal pole identified within either sac. There is a small subarachnoid hemorrhage measuring 4 x 7 mm. There are 2 complex structure in the region of the right adnexa measuring 1.3 x 4.3 x 1.3 cm and 1.7 x 1.3 x 1.4 cm likely representing corpus luteum. Ectopic pregnancy is less likely. Correlation with clinical exam and serial HCG levels and close follow-up with ultrasound recommended. The left ovary is unremarkable and measures 2.3 x 1.4 x 1.4 cm. Trace free fluid noted within the pelvis. IMPRESSION: Two early intrauterine gestational sacs, one appears to contain an early yolk sac. No fetal pole identified at this time. Close follow-up recommended. Small subchorionic hemorrhage. Right ovarian probable corpus luteum. Electronically Signed   By: Elgie Collard M.D.   On: 04/24/2015 22:56    Assessment & Plan:   # di-di twin gestation - feeling well. Seen on u/s today - start pnv - high risk ob f/u  Routine preventative health maintenance measures emphasized.     Noah B. Wouk, MD OB/GYN Fellow [facility] for [employer], [facility]

## 2015-08-18 ENCOUNTER — Encounter (HOSPITAL_COMMUNITY): Payer: Self-pay | Admitting: *Deleted

## 2015-08-18 ENCOUNTER — Inpatient Hospital Stay (HOSPITAL_COMMUNITY)
Admission: AD | Admit: 2015-08-18 | Discharge: 2015-08-18 | Disposition: A | Discharge status: Home / Self Care | Payer: Self-pay | Source: Ambulatory Visit | Attending: Family Medicine | Admitting: Family Medicine

## 2015-08-18 DIAGNOSIS — N938 Other specified abnormal uterine and vaginal bleeding: Secondary | ICD-10-CM

## 2015-08-18 DIAGNOSIS — N939 Abnormal uterine and vaginal bleeding, unspecified: Secondary | ICD-10-CM | POA: Insufficient documentation

## 2015-08-18 LAB — URINALYSIS, ROUTINE W REFLEX MICROSCOPIC
Bilirubin Urine: NEGATIVE
GLUCOSE, UA: NEGATIVE mg/dL
Hgb urine dipstick: NEGATIVE
KETONES UR: NEGATIVE mg/dL
LEUKOCYTES UA: NEGATIVE
NITRITE: NEGATIVE
PROTEIN: NEGATIVE mg/dL
Specific Gravity, Urine: <1.005 — ABNORMAL LOW (ref 1.005–1.030)
pH: 6.5 (ref 5.0–8.0)

## 2015-08-18 LAB — POCT PREGNANCY, URINE: PREG TEST UR: NEGATIVE

## 2015-08-18 NOTE — MAU Note (Signed)
Pt states she was pregnant in May with twins.  Pt states she had an abortion.  Pt states that this month she had spotting that started last Thursday, 7/13.  Pt states she had spotting though Monday.  Pt states she took three home pregnancy test and two were faint positives and one was negative.

## 2015-08-18 NOTE — MAU Provider Note (Signed)
History   Kendra Fields who had termination of twins in may and now is in with spotting and cramping but has not taken any thing for the pain. This is second episode of spotting this month. States is not on birth control.  CSN: [id number]  Arrival date & time 08/18/15  1239   None     Chief Complaint  Patient presents with  . Vaginal Bleeding    HPI  Past Medical History  Diagnosis Date  . Preterm labor   . Pregnancy induced hypertension   . Chlamydia December 2016  . Trichomonas vaginitis December 2016    Past Surgical History  Procedure Laterality Date  . No past surgeries      Family History  Problem Relation Age of Onset  . Alcohol abuse Mother   . Asthma Brother   . Stroke Maternal Uncle   . Cancer Maternal Grandmother     brain  . Diabetes Maternal Grandmother   . Diabetes Paternal Grandmother   . Hypertension Paternal Grandmother     Social History  Substance Use Topics  . Smoking status: Current Every Day Smoker    Types: Cigarettes  . Smokeless tobacco: None  . Alcohol Use: Yes     Comment: occassionally    OB History    Gravida Para Term Preterm AB TAB SAB Ectopic Multiple Living   4 3 0 3 1 0 0 0 1 2       Review of Systems  Constitutional: Negative.   HENT: Negative.   Eyes: Negative.   Respiratory: Negative.   Cardiovascular: Negative.   Gastrointestinal: Positive for abdominal pain.  Endocrine: Negative.   Genitourinary: Positive for vaginal bleeding.  Musculoskeletal: Negative.   Skin: Negative.   Allergic/Immunologic: Negative.   Neurological: Negative.   Hematological: Negative.   Psychiatric/Behavioral: Negative.     Allergies  Penicillins  Home Medications  No current outpatient prescriptions on file.  BP 124/80 mmHg  Pulse 92  Temp(Src) 98 F (36.7 C) (Oral)  Resp 16  LMP 08/09/2015  Breastfeeding? Unknown  Physical Exam  Constitutional: She is oriented to person, place, and time. She appears well-developed and  well-nourished.  HENT:  Head: Normocephalic.  Eyes: Pupils are equal, round, and reactive to light.  Neck: Normal range of motion.  Cardiovascular: Normal rate, regular rhythm, normal heart sounds and intact distal pulses.   Pulmonary/Chest: Effort normal and breath sounds normal.  Abdominal: Soft. Bowel sounds are normal.  Genitourinary: Vagina normal and uterus normal.  Musculoskeletal: Normal range of motion.  Neurological: She is alert and oriented to person, place, and time. She has normal reflexes.  Skin: Skin is warm and dry.  Psychiatric: She has a normal mood and affect. Her behavior is normal. Judgment and thought content normal.    MAU Course  Procedures (including critical care time)  Labs Reviewed  URINALYSIS, ROUTINE W REFLEX MICROSCOPIC (NOT AT [facility])  POCT PREGNANCY, URINE   No results found.   No diagnosis found.    MDM  preg test neg. lenghty discussion with pt regarding contraceptives. Pt states she is planning to go to [facility] and get birth control. OTC advil for cramping. Will d/c home

## 2015-08-18 NOTE — Discharge Instructions (Signed)

## 2016-08-12 ENCOUNTER — Encounter (HOSPITAL_COMMUNITY): Payer: Self-pay | Admitting: Emergency Medicine

## 2016-08-12 ENCOUNTER — Ambulatory Visit (HOSPITAL_COMMUNITY)
Admission: EM | Admit: 2016-08-12 | Discharge: 2016-08-12 | Disposition: A | Discharge status: Home / Self Care | Payer: Self-pay | Attending: Internal Medicine | Admitting: Internal Medicine

## 2016-08-12 DIAGNOSIS — F17210 Nicotine dependence, cigarettes, uncomplicated: Secondary | ICD-10-CM | POA: Insufficient documentation

## 2016-08-12 DIAGNOSIS — M545 Low back pain: Secondary | ICD-10-CM | POA: Insufficient documentation

## 2016-08-12 DIAGNOSIS — Z811 Family history of alcohol abuse and dependence: Secondary | ICD-10-CM | POA: Insufficient documentation

## 2016-08-12 DIAGNOSIS — Z825 Family history of asthma and other chronic lower respiratory diseases: Secondary | ICD-10-CM | POA: Insufficient documentation

## 2016-08-12 DIAGNOSIS — Z833 Family history of diabetes mellitus: Secondary | ICD-10-CM | POA: Insufficient documentation

## 2016-08-12 DIAGNOSIS — Z808 Family history of malignant neoplasm of other organs or systems: Secondary | ICD-10-CM | POA: Insufficient documentation

## 2016-08-12 DIAGNOSIS — N898 Other specified noninflammatory disorders of vagina: Secondary | ICD-10-CM | POA: Insufficient documentation

## 2016-08-12 DIAGNOSIS — Z880 Allergy status to penicillin: Secondary | ICD-10-CM | POA: Insufficient documentation

## 2016-08-12 DIAGNOSIS — Z823 Family history of stroke: Secondary | ICD-10-CM | POA: Insufficient documentation

## 2016-08-12 NOTE — ED Triage Notes (Signed)
[facility] a [facility] a vaginal discharge and odor x 2 days.

## 2016-08-12 NOTE — Discharge Instructions (Signed)
°  Your pelvic exam labs have been sent off.  You will be called if anything comes back positive. The appropriate medication will then be called to the pharmacy for you.  Refrain from sexual intercourse for 7 days if treatment is needed. Be sure to have all partners tested and treated for STDs.  Practice safe sex by always wearing condoms.   In order to help prevent any unwanted pregnancies it is highly recommended that you use birth control in addition to condoms.

## 2016-08-12 NOTE — ED Provider Notes (Signed)
CSN: [id number]     Arrival date & time 08/12/16  1725 History   First MD Initiated Contact with Patient 08/12/16 1753     Chief Complaint  Patient presents with  . Vaginal Discharge   (Consider location/radiation/quality/duration/timing/severity/associated sxs/prior Treatment) HPI Kendra Fields is a 26 y.o. female presenting to UC with c/o 2 days of vaginal discharge and odor. Reports of unprotected intercourse about 2 weeks ago. No specific known exposure to STIs. Mild low back pain but denies urinary symptoms. Denies fever, chills, n/v/d. LMP 07/28/16.    Past Medical History:  Diagnosis Date  . Chlamydia December 2016  . Pregnancy induced hypertension   . Preterm labor   . Trichomonas vaginitis December 2016   Past Surgical History:  Procedure Laterality Date  . NO PAST SURGERIES     Family History  Problem Relation Age of Onset  . Alcohol abuse Mother   . Asthma Brother   . Stroke Maternal Uncle   . Cancer Maternal Grandmother        brain  . Diabetes Maternal Grandmother   . Diabetes Paternal Grandmother   . Hypertension Paternal Grandmother    Social History  Substance Use Topics  . Smoking status: Current Every Day Smoker    Types: Cigarettes  . Smokeless tobacco: Not on file  . Alcohol use Yes     Comment: occassionally   OB History    Gravida Para Term Preterm AB Living   4 3 0 3 1 2    SAB TAB Ectopic Multiple Live Births   0 0 0 1 2     Review of Systems  Constitutional: Negative for chills and fever.  Gastrointestinal: Negative for abdominal pain, diarrhea, nausea and vomiting.  Genitourinary: Positive for vaginal discharge. Negative for dysuria, frequency, pelvic pain and vaginal pain.  Musculoskeletal: Positive for back pain. Negative for myalgias.  Skin: Negative for rash.    Allergies  Penicillins  Home Medications   Prior to Admission medications   Not on File   Meds Ordered and Administered this Visit  Medications - No data to  display  BP 104/70 (BP Location: Right Arm)   Pulse 84   Temp 99.1 F (37.3 C) (Oral)   Resp 16   LMP 07/28/2016 (Exact Date)   SpO2 100%  No data found.   Physical Exam  Constitutional: She is oriented to person, place, and time. She appears well-developed and well-nourished. No distress.  HENT:  Head: Normocephalic and atraumatic.  Eyes: EOM are normal.  Neck: Normal range of motion.  Cardiovascular: Normal rate and regular rhythm.   Pulmonary/Chest: Effort normal and breath sounds normal. No respiratory distress. She has no wheezes. She has no rales.  Abdominal: Soft. She exhibits no distension. There is no tenderness.  Genitourinary: Cervix exhibits discharge. Cervix exhibits no motion tenderness. Right adnexum displays no mass and no tenderness. Left adnexum displays no mass and no tenderness. Vaginal discharge found.  Genitourinary Comments: Chaperoned exam. Normal external exam. Vaginal canal: small amount of white-yellow thin discharge. No bleeding.   Musculoskeletal: Normal range of motion.  Neurological: She is alert and oriented to person, place, and time.  Skin: Skin is warm and dry. She is not diaphoretic.  Psychiatric: She has a normal mood and affect. Her behavior is normal.  Nursing note and vitals reviewed.   Urgent Care Course     Procedures (including critical care time)  Labs Review Labs Reviewed  CERVICOVAGINAL ANCILLARY ONLY    Imaging Review  No results found.   MDM   1. Vaginal discharge    Hx of unprotected intercourse about 2 weeks ago. C/o vaginal discharge.   LMP: 07/28/16 Pt not on birth control. Encouraged use of condoms and BC to help prevent unwanted pregnancies.    GC/Chlamydia, yeast, and BV swab sent to lab. [facility] related needs.  Home info packet provided.   Lurene Shadow, [place] 08/12/16 [phone]

## 2016-08-13 ENCOUNTER — Telehealth (HOSPITAL_COMMUNITY): Payer: Self-pay | Admitting: Internal Medicine

## 2016-08-13 LAB — CERVICOVAGINAL ANCILLARY ONLY
Bacterial vaginitis: POSITIVE — AB
Candida vaginitis: NEGATIVE
Chlamydia: NEGATIVE
Neisseria Gonorrhea: NEGATIVE
Trichomonas: POSITIVE — AB

## 2016-08-13 MED ORDER — METRONIDAZOLE 500 MG PO TABS: 500.0000 mg | ORAL_TABLET | Freq: Two times a day (BID) | ORAL | 0 refills | Status: DC

## 2016-08-13 NOTE — Telephone Encounter (Signed)
Clinical staff, please let patient know that tests for gardnerella (bacterial vaginosis) and for trichomonas were positive.  Rx metronidazole was sent to the pharmacy of record, [facility] on [address].  Please refrain from sexual intercourse for 7 days to give the medicine time to work.  Sexual partners need to be notified and tested/treated for trichomonas.    Condoms may reduce risk of reinfection.  Recheck for further evaluation if symptoms are not improving. LM

## 2016-09-14 IMAGING — DX DG CHEST 2V
2 series · 2 of 2 positions shown · non-contrast
Comparison: 10/27/2012

CLINICAL DATA: Cough and generalized body aches.

EXAM:
CHEST  2 VIEW

[chest pa]
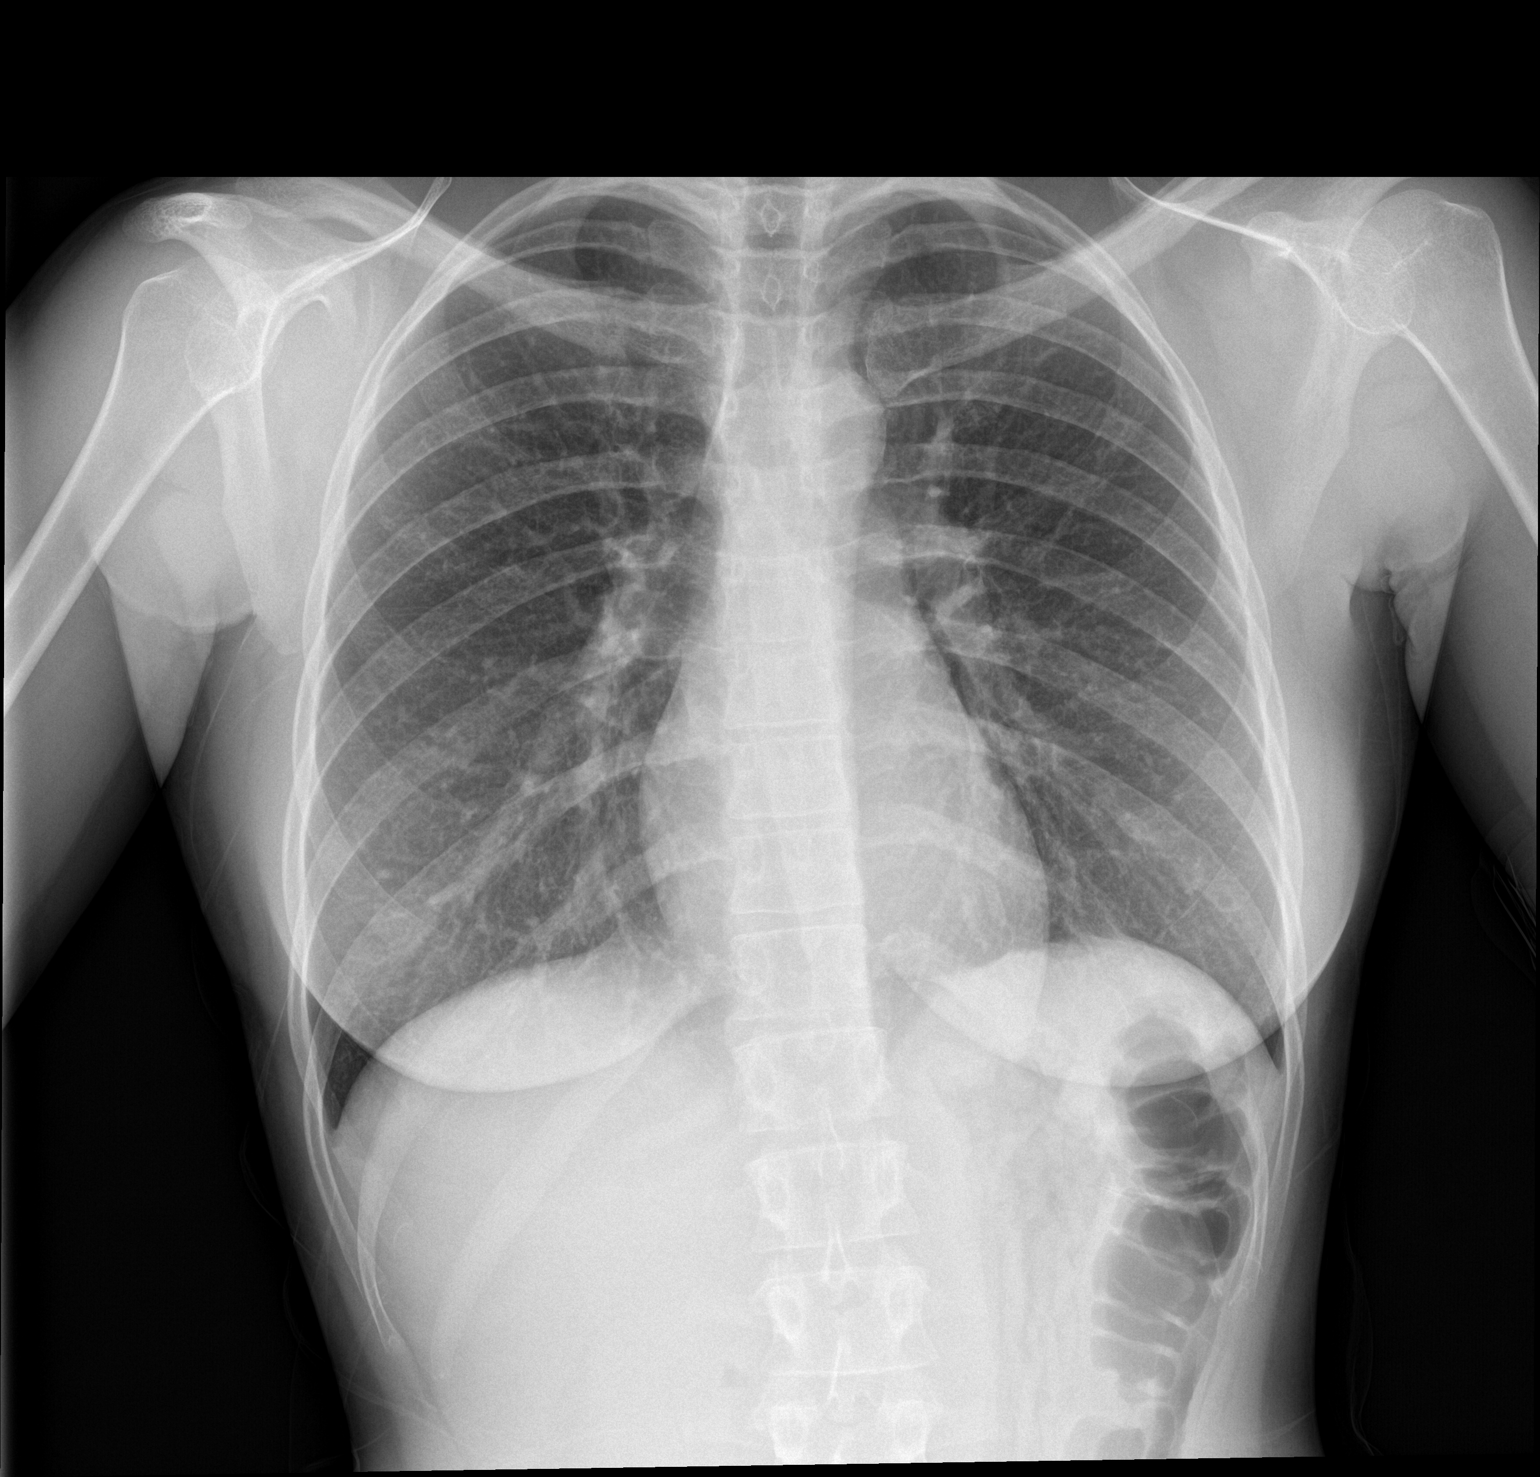

[chest lat]
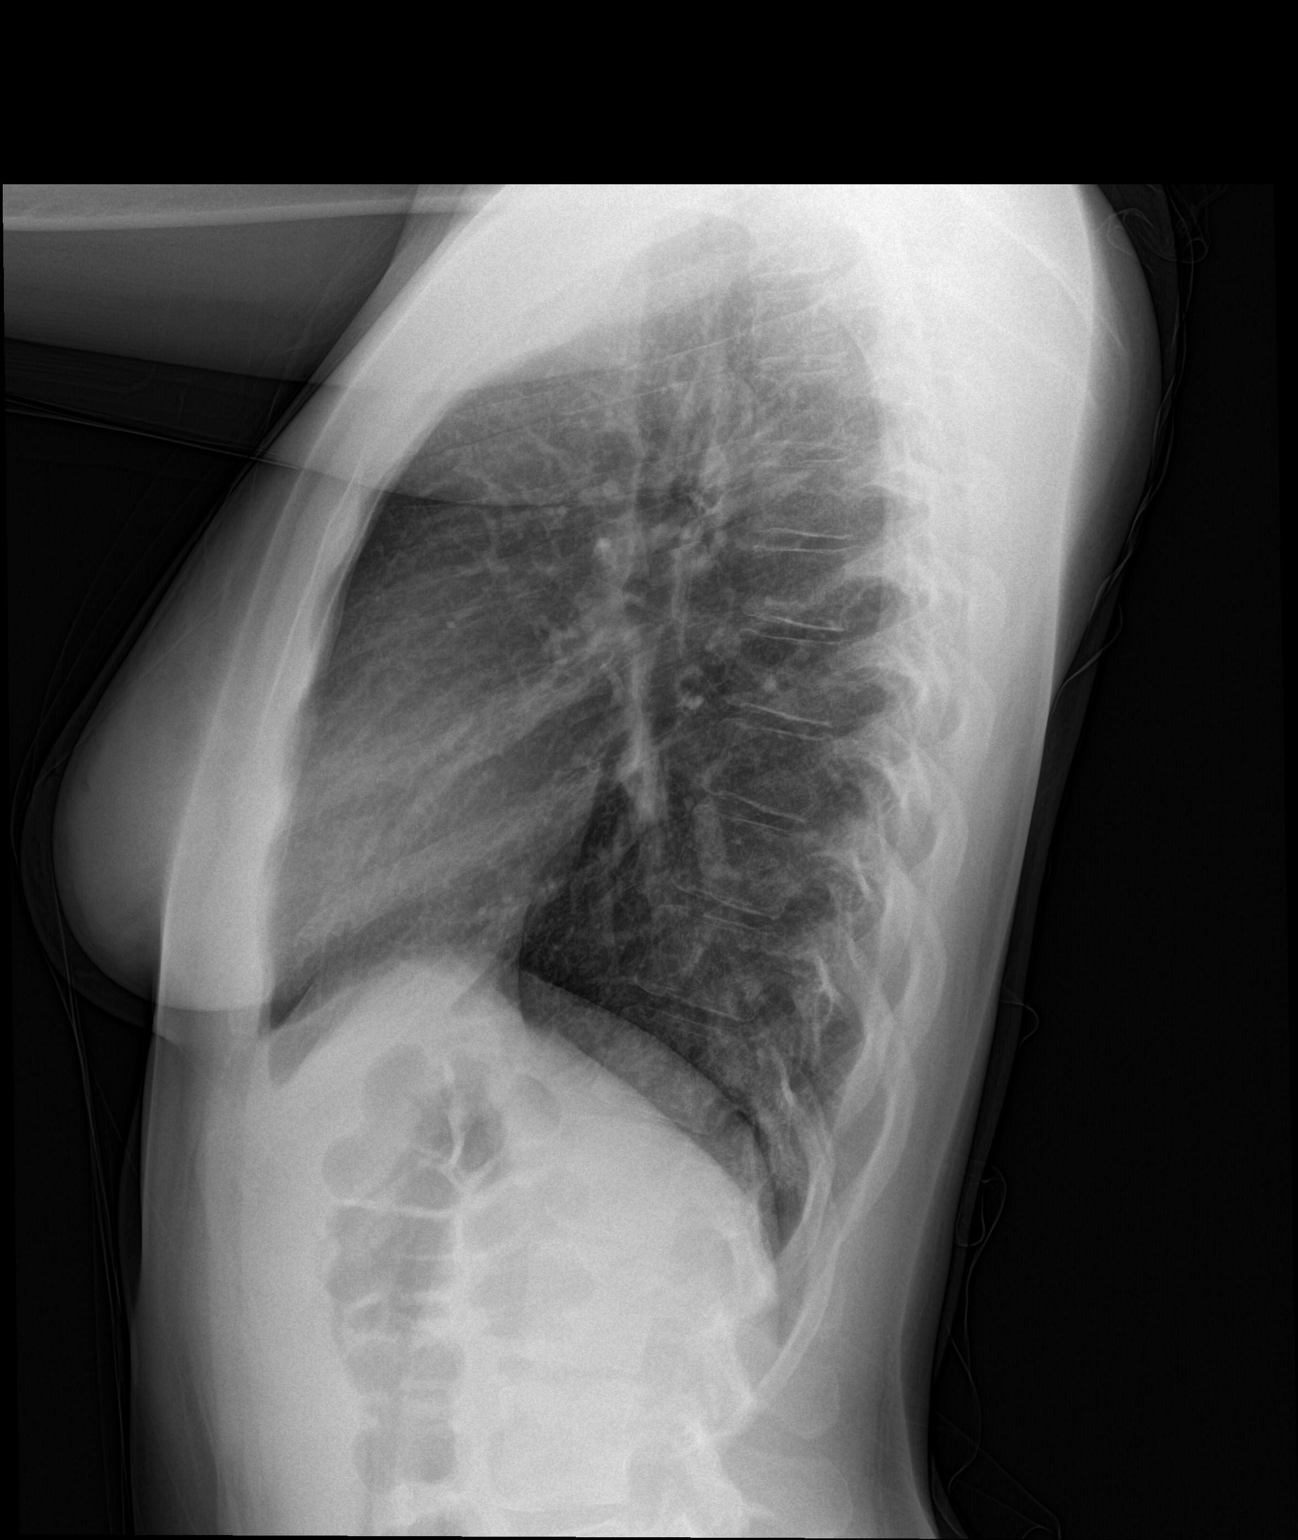

[2 of 2 positions shown; findings below may reference images not displayed]

FINDINGS: The heart size and mediastinal contours are within normal limits.
Both lungs are clear. The visualized skeletal structures are
unremarkable.
IMPRESSION: Normal exam.

## 2017-08-21 ENCOUNTER — Encounter (HOSPITAL_COMMUNITY): Payer: Self-pay | Admitting: Emergency Medicine

## 2017-08-21 ENCOUNTER — Ambulatory Visit (HOSPITAL_COMMUNITY)
Admission: EM | Admit: 2017-08-21 | Discharge: 2017-08-21 | Disposition: A | Discharge status: Home / Self Care | Payer: Self-pay | Attending: Family Medicine | Admitting: Family Medicine

## 2017-08-21 ENCOUNTER — Other Ambulatory Visit: Payer: Self-pay

## 2017-08-21 DIAGNOSIS — R112 Nausea with vomiting, unspecified: Secondary | ICD-10-CM | POA: Insufficient documentation

## 2017-08-21 DIAGNOSIS — R1031 Right lower quadrant pain: Secondary | ICD-10-CM | POA: Insufficient documentation

## 2017-08-21 LAB — POCT I-STAT, CHEM 8
BUN: 3 mg/dL — ABNORMAL LOW (ref 6–20)
CHLORIDE: 99 mmol/L (ref 98–111)
CREATININE: 0.6 mg/dL (ref 0.44–1.00)
Calcium, Ion: 1.09 mmol/L — ABNORMAL LOW (ref 1.15–1.40)
GLUCOSE: 91 mg/dL (ref 70–99)
HEMATOCRIT: 37 % (ref 36.0–46.0)
Hemoglobin: 12.6 g/dL (ref 12.0–15.0)
POTASSIUM: 3.9 mmol/L (ref 3.5–5.1)
Sodium: 133 mmol/L — ABNORMAL LOW (ref 135–145)
TCO2: 24 mmol/L (ref 22–32)

## 2017-08-21 LAB — POCT URINALYSIS DIP (DEVICE)
Glucose, UA: NEGATIVE mg/dL
Ketones, ur: >=160 mg/dL — AB
NITRITE: NEGATIVE
PH: 6.5 (ref 5.0–8.0)
Protein, ur: 30 mg/dL — AB
SPECIFIC GRAVITY, URINE: 1.01 (ref 1.005–1.030)
Urobilinogen, UA: 1 mg/dL (ref 0.0–1.0)

## 2017-08-21 LAB — CBC
HCT: 35.6 % — ABNORMAL LOW (ref 36.0–46.0)
Hemoglobin: 11.6 g/dL — ABNORMAL LOW (ref 12.0–15.0)
MCH: 26.5 pg (ref 26.0–34.0)
MCHC: 32.6 g/dL (ref 30.0–36.0)
MCV: 81.3 fL (ref 78.0–100.0)
Platelets: 317 10*3/uL (ref 150–400)
RBC: 4.38 MIL/uL (ref 3.87–5.11)
RDW: 15.1 % (ref 11.5–15.5)
WBC: 9.5 10*3/uL (ref 4.0–10.5)

## 2017-08-21 LAB — POCT PREGNANCY, URINE: Preg Test, Ur: NEGATIVE

## 2017-08-21 MED ORDER — ONDANSETRON 4 MG PO TBDP
4.0000 mg | ORAL_TABLET | Freq: Once | ORAL | Status: AC
  Administered 2017-08-21: 4 mg via ORAL

## 2017-08-21 MED ORDER — KETOROLAC TROMETHAMINE 30 MG/ML IJ SOLN: 30.0000 mg | Freq: Once | INTRAMUSCULAR | Status: DC

## 2017-08-21 MED ORDER — KETOROLAC TROMETHAMINE 30 MG/ML IJ SOLN
INTRAMUSCULAR | Status: AC
  Filled 2017-08-21: qty 1

## 2017-08-21 MED ORDER — ONDANSETRON 4 MG PO TBDP
ORAL_TABLET | ORAL | Status: AC
Start: 2017-08-21 — End: ?
  Filled 2017-08-21: qty 1

## 2017-08-21 MED ORDER — ONDANSETRON HCL 4 MG PO TABS: 4.0000 mg | ORAL_TABLET | Freq: Four times a day (QID) | ORAL | 0 refills | Status: DC

## 2017-08-21 NOTE — Discharge Instructions (Signed)
Make sure you are drinking plenty of clear liquids Add bland diet as tolerated Zofran as needed for nausea and vomiting Rest, limit your activity Return if you are worse at any time instead of of better, higher fever, worsening abdominal pain, or are unable to keep down food and fluids

## 2017-08-21 NOTE — ED Provider Notes (Signed)
[facility]    CSN: [id number] Arrival date & time: 08/21/17  1721     History   Chief Complaint Chief Complaint  Patient presents with  . Emesis    HPI Kendra Fields is a 27 y.o. female.   HPI  Abdominal cramping, nausea, vomiting, and some diarrhea for the last 3 to 4 days.  No one else at home is sick.  She has some headache today.  She is keeping down water.  She does not think she is dehydrated.  She has not eaten very much this week.  No history of migraines or headaches.  She is having crampy abdominal pain.  Periumbilical.  She had some loose bowel movements this morning.  Not watery, no mucus, no blood.  Past Medical History:  Diagnosis Date  . Chlamydia December 2016  . Pregnancy induced hypertension   . Preterm labor   . Trichomonas vaginitis December 2016    Patient Active Problem List   Diagnosis Date Noted  . Twin gestation, dichorionic diamniotic 05/07/2015    Past Surgical History:  Procedure Laterality Date  . NO PAST SURGERIES      OB History    Gravida  4   Para  3   Term  0   Preterm  3   AB  1   Living  2     SAB  0   TAB  0   Ectopic  0   Multiple  1   Live Births  2            Home Medications    Prior to Admission medications   Medication Sig Start Date End Date Taking? Authorizing Provider  ondansetron (ZOFRAN) 4 MG tablet Take 1 tablet (4 mg total) by mouth every 6 (six) hours. 08/21/17   Eustace MooreNelson, Idalis Hoelting Sue, MD    Family History Family History  Problem Relation Age of Onset  . Alcohol abuse Mother   . Asthma Brother   . Stroke Maternal Uncle   . Cancer Maternal Grandmother        brain  . Diabetes Maternal Grandmother   . Diabetes Paternal Grandmother   . Hypertension Paternal Grandmother     Social History Social History   Tobacco Use  . Smoking status: Former Smoker    Types: Cigarettes  . Smokeless tobacco: Never Used  Substance Use Topics  . Alcohol use: Yes    Comment:  occassionally  . Drug use: No     Allergies   Penicillins   Review of Systems Review of Systems  Constitutional: Positive for fatigue. Negative for chills and fever.  HENT: Negative for ear pain and sore throat.   Eyes: Negative for pain and visual disturbance.  Respiratory: Negative for cough and shortness of breath.   Cardiovascular: Negative for chest pain and palpitations.  Gastrointestinal: Positive for abdominal pain, diarrhea, nausea and vomiting.  Genitourinary: Negative for dysuria and hematuria.  Musculoskeletal: Negative for arthralgias and back pain.  Skin: Negative for color change and rash.  Neurological: Positive for headaches. Negative for seizures and syncope.  All other systems reviewed and are negative.    Physical Exam Triage Vital Signs ED Triage Vitals  Enc Vitals Group     BP 08/21/17 1734 114/80     Pulse Rate 08/21/17 1734 (!) 108     Resp 08/21/17 1734 20     Temp 08/21/17 1734 100.2 F (37.9 C)     Temp Source 08/21/17 1734 Oral  SpO2 08/21/17 1734 100 %     Weight 08/21/17 1747 120 lb (54.4 kg)     Height 08/21/17 1747 5\' 5"  (1.651 m)     Head Circumference --      Peak Flow --      Pain Score 08/21/17 1747 9     Pain Loc --      Pain Edu? --      Excl. in GC? --    No data found.  Updated Vital Signs BP 114/80 (BP Location: Left Arm)   Pulse (!) 108   Temp 100.2 F (37.9 C) (Oral)   Resp 20   Ht 5\' 5"  (1.651 m)   Wt 120 lb (54.4 kg)   LMP 07/28/2017   SpO2 100%   BMI 19.97 kg/m      Physical Exam  Constitutional: She appears well-developed and well-nourished. No distress.  HENT:  Head: Normocephalic and atraumatic.  Right Ear: External ear normal.  Left Ear: External ear normal.  Mouth/Throat: Oropharynx is clear and moist.  Mucous membranes are moist  Eyes: Pupils are equal, round, and reactive to light. Conjunctivae are normal.  Neck: Normal range of motion.  Cardiovascular: Normal rate, regular rhythm and normal  heart sounds.  Pulmonary/Chest: Effort normal and breath sounds normal. No respiratory distress.  Abdominal: Soft. She exhibits no distension. There is tenderness.  Generalized abdominal tenderness to palpation.  Most acute in right lower quadrant.  Mild guarding.  Mild rebound.  No organomegaly.  Musculoskeletal: Normal range of motion. She exhibits no edema.  Lymphadenopathy:    She has no cervical adenopathy.  Neurological: She is alert.  Skin: Skin is warm and dry.  Psychiatric: She has a normal mood and affect. Her behavior is normal.     UC Treatments / Results  Labs (all labs ordered are listed, but only abnormal results are displayed) Labs Reviewed  CBC - Abnormal; Notable for the following components:      Result Value   Hemoglobin 11.6 (*)    HCT 35.6 (*)    All other components within normal limits  POCT URINALYSIS DIP (DEVICE) - Abnormal; Notable for the following components:   Bilirubin Urine SMALL (*)    Ketones, ur >=160 (*)    Hgb urine dipstick TRACE (*)    Protein, ur 30 (*)    Leukocytes, UA TRACE (*)    All other components within normal limits  POCT I-STAT, CHEM 8 - Abnormal; Notable for the following components:   Sodium 133 (*)    BUN 3 (*)    Calcium, Ion 1.09 (*)    All other components within normal limits  POCT PREGNANCY, URINE    EKG None  Radiology No results found.  Procedures Procedures (including critical care time)  Medications Ordered in UC Medications  ketorolac (TORADOL) 30 MG/ML injection 30 mg (has no administration in time range)  ondansetron (ZOFRAN-ODT) disintegrating tablet 4 mg (4 mg Oral Given 08/21/17 1858)    Initial Impression / Assessment and Plan / UC Course  I have reviewed the triage vital signs and the nursing notes.  Pertinent labs & imaging results that were available during my care of the patient were reviewed by me and considered in my medical decision making (see chart for details).     Discussed with  patient that her white count is normal.  She may be a little dehydrated.  I do not see any sign of an acute abdominal process.  It is possible  she does have a subacute appendicitis.  If she gets worse instead of better and she needs to come back to the emergency room.  She does feel slightly better after the Zofran.  She prefers home management still going to the ER for IV fluids and additional testing. Final Clinical Impressions(s) / UC Diagnoses   Final diagnoses:  Non-intractable vomiting with nausea, unspecified vomiting type  Right lower quadrant abdominal pain     Discharge Instructions     Make sure you are drinking plenty of clear liquids Add bland diet as tolerated Zofran as needed for nausea and vomiting Rest, limit your activity Return if you are worse at any time instead of of better, higher fever, worsening abdominal pain, or are unable to keep down food and fluids     ED Prescriptions    Medication Sig Dispense Auth. Provider   ondansetron (ZOFRAN) 4 MG tablet Take 1 tablet (4 mg total) by mouth every 6 (six) hours. 12 tablet Eustace Moore, MD     Controlled Substance Prescriptions [place] Controlled Substance Registry consulted? Not Applicable   Eustace Moore, MD 08/21/17 2007

## 2017-08-21 NOTE — ED Triage Notes (Signed)
Patient states she has been nauseated and vomiting since Tuesday, fever and headache

## 2018-01-13 ENCOUNTER — Emergency Department (HOSPITAL_COMMUNITY)
Admission: EM | Admit: 2018-01-13 | Discharge: 2018-01-13 | Disposition: A | Discharge status: Home / Self Care | Payer: Self-pay | Attending: Emergency Medicine | Admitting: Emergency Medicine

## 2018-01-13 ENCOUNTER — Encounter (HOSPITAL_COMMUNITY): Payer: Self-pay | Admitting: Emergency Medicine

## 2018-01-13 ENCOUNTER — Other Ambulatory Visit: Payer: Self-pay

## 2018-01-13 DIAGNOSIS — Z87891 Personal history of nicotine dependence: Secondary | ICD-10-CM | POA: Insufficient documentation

## 2018-01-13 DIAGNOSIS — M25511 Pain in right shoulder: Secondary | ICD-10-CM | POA: Insufficient documentation

## 2018-01-13 DIAGNOSIS — R0781 Pleurodynia: Secondary | ICD-10-CM | POA: Insufficient documentation

## 2018-01-13 MED ORDER — METHOCARBAMOL 500 MG PO TABS: 500.0000 mg | ORAL_TABLET | Freq: Two times a day (BID) | ORAL | 0 refills | Status: DC

## 2018-01-13 NOTE — Discharge Instructions (Addendum)
Please read attached information. If you experience any new or worsening signs or symptoms please return to the emergency room for evaluation. Please follow-up with your primary care provider or specialist as discussed. Please use medication prescribed only as directed and discontinue taking if you have any concerning signs or symptoms.   °

## 2018-01-13 NOTE — ED Provider Notes (Signed)
[facility] EMERGENCY DEPARTMENT Provider Note   CSN: [id number] Arrival date & time: 01/13/18  1226   History   Chief Complaint Chief Complaint  Patient presents with  . right side pain    HPI Kendra Fields is a 27 y.o. female.  HPI   27 year old female presents today with complaints of right shoulder and rib pain.  Patient notes 3 days ago she was doing laundry carrying a very heavy plastic bag.  She notes the following day she developed pain in the right shoulder worse with movement, the pain spread down into her right lateral ribs.  She notes she cannot lay on that side secondary to pain, worse with inspiration worse with palpation, and worse with movement of the shoulder.  She has not tried any medications at home for this.  She denies any productive cough or fever, denies shortness of breath at rest.  No associate abdominal pain, pain is not worsened with eating.  Patient has no significant risk factors for pulmonary embolism, she does not smoke, no birth control, prolonged immobilization, trauma, pregnancy, malignancy, or any other risk factors.  No lower extremity swelling or edema.   Past Medical History:  Diagnosis Date  . Chlamydia December 2016  . Pregnancy induced hypertension   . Preterm labor   . Trichomonas vaginitis December 2016    Patient Active Problem List   Diagnosis Date Noted  . Twin gestation, dichorionic diamniotic 05/07/2015    Past Surgical History:  Procedure Laterality Date  . NO PAST SURGERIES       OB History    Gravida  4   Para  3   Term  0   Preterm  3   AB  1   Living  2     SAB  0   TAB  0   Ectopic  0   Multiple  1   Live Births  2            Home Medications    Prior to Admission medications   Medication Sig Start Date End Date Taking? Authorizing Provider  methocarbamol (ROBAXIN) 500 MG tablet Take 1 tablet (500 mg total) by mouth 2 (two) times daily. 01/13/18   Wallis Spizzirri, Tinnie Gens,  PA-C  ondansetron (ZOFRAN) 4 MG tablet Take 1 tablet (4 mg total) by mouth every 6 (six) hours. 08/21/17   Eustace Moore, MD    Family History Family History  Problem Relation Age of Onset  . Alcohol abuse Mother   . Asthma Brother   . Stroke Maternal Uncle   . Cancer Maternal Grandmother        brain  . Diabetes Maternal Grandmother   . Diabetes Paternal Grandmother   . Hypertension Paternal Grandmother     Social History Social History   Tobacco Use  . Smoking status: Former Smoker    Types: Cigarettes  . Smokeless tobacco: Never Used  Substance Use Topics  . Alcohol use: Yes    Comment: occassionally  . Drug use: No     Allergies   Penicillins   Review of Systems Review of Systems  All other systems reviewed and are negative.    Physical Exam Updated Vital Signs BP 102/75   Pulse 82   Temp 98.8 F (37.1 C) (Oral)   Resp 16   SpO2 100%   Physical Exam Vitals signs and nursing note reviewed.  Constitutional:      Appearance: She is well-developed.  HENT:  Head: Normocephalic and atraumatic.  Eyes:     General: No scleral icterus.       Right eye: No discharge.        Left eye: No discharge.     Conjunctiva/sclera: Conjunctivae normal.     Pupils: Pupils are equal, round, and reactive to light.  Neck:     Musculoskeletal: Normal range of motion.     Vascular: No JVD.     Trachea: No tracheal deviation.  Pulmonary:     Effort: Pulmonary effort is normal. No respiratory distress.     Breath sounds: Normal breath sounds. No stridor. No wheezing, rhonchi or rales.  Chest:     Chest wall: No tenderness.  Musculoskeletal:     Comments: Tenderness palpation or right anterior and posterior shoulder, tenderness palpation of the right lateral ribs, no signs of trauma range of motion of the shoulder causes increased pain  Neurological:     Mental Status: She is alert and oriented to person, place, and time.     Coordination: Coordination normal.    Psychiatric:        Behavior: Behavior normal.        Thought Content: Thought content normal.        Judgment: Judgment normal.      ED Treatments / Results  Labs (all labs ordered are listed, but only abnormal results are displayed) Labs Reviewed - No data to display  EKG None  Radiology No results found.  Procedures Procedures (including critical care time)  Medications Ordered in ED Medications - No data to display   Initial Impression / Assessment and Plan / ED Course  I have reviewed the triage vital signs and the nursing notes.  Pertinent labs & imaging results that were available during my care of the patient were reviewed by me and considered in my medical decision making (see chart for details).     27 year old female presents today with likely muscular pain.  She has tenderness upon the ribs, has very reassuring work-up with no signs of pulmonary embolism; PERC negative Wells 0.  Low suspicion for infection.  Symptomatic care instructions given.  Patient will return if symptoms do not improve or worsen.  She verbalized understanding and agreement to today's plan.  Final Clinical Impressions(s) / ED Diagnoses   Final diagnoses:  Rib pain    ED Discharge Orders         Ordered    methocarbamol (ROBAXIN) 500 MG tablet  2 times daily     01/13/18 1339           Eyvonne MechanicHedges, Sandy Blouch, PA-C 01/13/18 1340    Mancel BaleWentz, Elliott, MD 01/14/18 [phone]

## 2018-01-13 NOTE — ED Triage Notes (Signed)
Pt states two days ago the right side of her chest/torso has been painful. Pt states she was carrying a very large bag of laundry on her right side the other day and pain possibly is from that. Pt states when she breathes or coughs that side of her body hurts, from her neck and shoulder to up under her rib.

## 2018-05-08 ENCOUNTER — Encounter (HOSPITAL_COMMUNITY): Payer: Self-pay | Admitting: Emergency Medicine

## 2018-05-08 ENCOUNTER — Emergency Department (HOSPITAL_COMMUNITY)
Admission: EM | Admit: 2018-05-08 | Discharge: 2018-05-08 | Disposition: A | Discharge status: Home / Self Care | Payer: Self-pay | Attending: Emergency Medicine | Admitting: Emergency Medicine

## 2018-05-08 ENCOUNTER — Other Ambulatory Visit: Payer: Self-pay

## 2018-05-08 DIAGNOSIS — Z87891 Personal history of nicotine dependence: Secondary | ICD-10-CM | POA: Insufficient documentation

## 2018-05-08 DIAGNOSIS — Y939 Activity, unspecified: Secondary | ICD-10-CM | POA: Insufficient documentation

## 2018-05-08 DIAGNOSIS — S41111A Laceration without foreign body of right upper arm, initial encounter: Secondary | ICD-10-CM

## 2018-05-08 DIAGNOSIS — Y999 Unspecified external cause status: Secondary | ICD-10-CM | POA: Insufficient documentation

## 2018-05-08 DIAGNOSIS — S51811A Laceration without foreign body of right forearm, initial encounter: Secondary | ICD-10-CM | POA: Insufficient documentation

## 2018-05-08 DIAGNOSIS — W260XXA Contact with knife, initial encounter: Secondary | ICD-10-CM | POA: Insufficient documentation

## 2018-05-08 DIAGNOSIS — Y929 Unspecified place or not applicable: Secondary | ICD-10-CM | POA: Insufficient documentation

## 2018-05-08 MED ORDER — LIDOCAINE-EPINEPHRINE (PF) 2 %-1:200000 IJ SOLN
10.0000 mL | Freq: Once | INTRAMUSCULAR | Status: AC
  Administered 2018-05-08: 10 mL
  Filled 2018-05-08: qty 20

## 2018-05-08 MED ORDER — HYDROCODONE-ACETAMINOPHEN 5-325 MG PO TABS: 1.0000 | ORAL_TABLET | Freq: Four times a day (QID) | ORAL | 0 refills | Status: DC | PRN

## 2018-05-08 MED ORDER — HYDROCODONE-ACETAMINOPHEN 5-325 MG PO TABS
1.0000 | ORAL_TABLET | Freq: Once | ORAL | Status: AC
  Administered 2018-05-08: 22:00:00 1 via ORAL
  Filled 2018-05-08: qty 1

## 2018-05-08 NOTE — Discharge Instructions (Addendum)
Your last tetanus shot was in August 2015.  Please take Ibuprofen (Advil, motrin) and Tylenol (acetaminophen) to relieve your pain.  You may take up to 600 MG (3 pills) of normal strength ibuprofen every 8 hours as needed.  In between doses of ibuprofen you make take tylenol, up to 1,000 mg (two extra strength pills).  Do not take more than 3,000 mg tylenol in a 24 hour period.  Please check all medication labels as many medications such as pain and cold medications may contain tylenol.  Do not drink alcohol while taking these medications.  Do not take other NSAID'S while taking ibuprofen (such as aleve or naproxen).  Please take ibuprofen with food to decrease stomach upset.  Today you received medications that may make you sleepy or impair your ability to make decisions.  For the next 24 hours please do not drive, operate heavy machinery, care for a small child with out another adult present, or perform any activities that may cause harm to you or someone else if you were to fall asleep or be impaired.   You are being prescribed a medication which may make you sleepy. Please follow up of listed precautions for at least 24 hours after taking one dose.

## 2018-05-08 NOTE — ED Triage Notes (Signed)
Patient here with laceration to right forearm.  States she was trying to open a box with box cutter and cut her arm.  Bleeding controlled at this time.

## 2018-05-08 NOTE — ED Notes (Signed)
Discharge instructions discussed with pt. Pt. Verbalized understanding and no questions at this time.

## 2018-05-08 NOTE — ED Provider Notes (Signed)
[facility] EMERGENCY DEPARTMENT Provider Note   CSN: [id number] Arrival date & time: 05/08/18  1930    History   Chief Complaint Chief Complaint  Patient presents with  . Laceration    HPI Kendra Fields is a 28 y.o. female with no significant past medical history who presents today for evaluation of a laceration on her right arm.  She reports that she had a new box cutter today that she used to cut open a box with hair that she had ordered when it slipped and she accidentally cut her right forearm.  She is unsure when her last tetanus shot was.  She denies this being an attempt at self-harm, SI, or HI.  She reports that this happened in under 1 hour prior to arrival.  She denies any numbness, tingling, or weakness.  No other injuries.     HPI  Past Medical History:  Diagnosis Date  . Chlamydia December 2016  . Pregnancy induced hypertension   . Preterm labor   . Trichomonas vaginitis December 2016    Patient Active Problem List   Diagnosis Date Noted  . Twin gestation, dichorionic diamniotic 05/07/2015    Past Surgical History:  Procedure Laterality Date  . NO PAST SURGERIES       OB History    Gravida  4   Para  3   Term  0   Preterm  3   AB  1   Living  2     SAB  0   TAB  0   Ectopic  0   Multiple  1   Live Births  2            Home Medications    Prior to Admission medications   Medication Sig Start Date End Date Taking? Authorizing Provider  HYDROcodone-acetaminophen (NORCO/VICODIN) 5-325 MG tablet Take 1 tablet by mouth every 6 (six) hours as needed for severe pain. 05/08/18   Cristina Gong, PA-C  methocarbamol (ROBAXIN) 500 MG tablet Take 1 tablet (500 mg total) by mouth 2 (two) times daily. 01/13/18   Hedges, Tinnie Gens, PA-C  ondansetron (ZOFRAN) 4 MG tablet Take 1 tablet (4 mg total) by mouth every 6 (six) hours. 08/21/17   Eustace Moore, MD    Family History Family History  Problem Relation Age of  Onset  . Alcohol abuse Mother   . Asthma Brother   . Stroke Maternal Uncle   . Cancer Maternal Grandmother        brain  . Diabetes Maternal Grandmother   . Diabetes Paternal Grandmother   . Hypertension Paternal Grandmother     Social History Social History   Tobacco Use  . Smoking status: Former Smoker    Types: Cigarettes  . Smokeless tobacco: Never Used  Substance Use Topics  . Alcohol use: Yes    Comment: occassionally  . Drug use: No     Allergies   Penicillins   Review of Systems Review of Systems  Constitutional: Negative for chills and fever.  Skin: Positive for wound.  Psychiatric/Behavioral: Negative for behavioral problems, dysphoric mood and self-injury. The patient is not nervous/anxious.   All other systems reviewed and are negative.    Physical Exam Updated Vital Signs BP (!) 135/102   Pulse 76   Temp 98.3 F (36.8 C) (Oral)   Resp 17   SpO2 100%   Physical Exam Vitals signs and nursing note reviewed.  Constitutional:      General:  She is not in acute distress.    Appearance: She is not ill-appearing.  HENT:     Head: Normocephalic.  Cardiovascular:     Rate and Rhythm: Normal rate.     Pulses: Normal pulses.     Comments: 2+ right radial pulse, brisk capillary refill to fingers on right hand. Pulmonary:     Effort: Pulmonary effort is normal. No respiratory distress.  Musculoskeletal:     Comments: Right upper extremity has full, pain-free range of motion, 5/5 strength.  Skin:    Comments: There is a 3 cm laceration on the right proximal volar forearm.  Laceration is through the skin into the subcutaneous tissue.  Mild oozing.  There is no debris in the wound.  See clinical image.  Neurological:     Mental Status: She is alert.        ED Treatments / Results  Labs (all labs ordered are listed, but only abnormal results are displayed) Labs Reviewed - No data to display  EKG None  Radiology No results found.  Procedures  .Marland Kitchen.Laceration Repair Date/Time: 05/08/2018 10:46 PM Performed by: Cristina GongHammond, Brennley Curtice W, PA-C Authorized by: Cristina GongHammond, Latiqua Daloia W, PA-C   Consent:    Consent obtained:  Verbal   Consent given by:  Patient   Risks discussed:  Infection, need for additional repair, poor cosmetic result, pain, retained foreign body, tendon damage, vascular damage, poor wound healing and nerve damage   Alternatives discussed:  No treatment and referral (Alternative wound closures) Anesthesia (see MAR for exact dosages):    Anesthesia method:  Local infiltration   Local anesthetic:  Lidocaine 2% WITH epi Laceration details:    Location:  Shoulder/arm   Shoulder/arm location:  R lower arm   Length (cm):  3 Repair type:    Repair type:  Simple Pre-procedure details:    Preparation:  Patient was prepped and draped in usual sterile fashion Exploration:    Hemostasis achieved with:  Epinephrine and direct pressure   Wound exploration: wound explored through full range of motion and entire depth of wound probed and visualized     Wound extent: no fascia violation noted, no foreign bodies/material noted, no muscle damage noted, no nerve damage noted, no tendon damage noted and no vascular damage noted   Treatment:    Area cleansed with:  Soap and water and Betadine   Amount of cleaning:  Standard Skin repair:    Repair method:  Sutures   Suture size:  4-0   Suture material:  Prolene   Suture technique:  Simple interrupted   Number of sutures:  5 Approximation:    Approximation:  Close Post-procedure details:    Dressing:  Antibiotic ointment, non-adherent dressing and bulky dressing   Patient tolerance of procedure:  Tolerated well, no immediate complications   (including critical care time)  Medications Ordered in ED Medications  lidocaine-EPINEPHrine (XYLOCAINE W/EPI) 2 %-1:200000 (PF) injection 10 mL (10 mLs Infiltration Given 05/08/18 2142)  HYDROcodone-acetaminophen (NORCO/VICODIN) 5-325 MG per  tablet 1 tablet (1 tablet Oral Given 05/08/18 2142)     Initial Impression / Assessment and Plan / ED Course  I have reviewed the triage vital signs and the nursing notes.  Pertinent labs & imaging results that were available during my care of the patient were reviewed by me and considered in my medical decision making (see chart for details).       Wound explored and base of wound visualized in a bloodless field without evidence of foreign  body.  Laceration occurred < 8 hours prior to repair which was well tolerated. Chart review shows tdap UTD, is in under 5 years.  Pt has  no comorbidities to effect normal wound healing. Pt discharged  without antibiotics.  Discussed suture home care with patient and answered questions. Pt to follow-up for wound check and suture removal in 10-14 days; they are to return to the ED sooner for signs of infection. Pt is hemodynamically stable with no complaints prior to dc.   She is instructed on over-the-counter ibuprofen and Tylenol.  In addition to this she is given a dose of Vicodin here and, after PMP database was reviewed, prescription for short course of Vicodin at home.  Final Clinical Impressions(s) / ED Diagnoses   Final diagnoses:  Laceration of right upper extremity, initial encounter    ED Discharge Orders         Ordered    HYDROcodone-acetaminophen (NORCO/VICODIN) 5-325 MG tablet  Every 6 hours PRN     05/08/18 2137           Cristina Gong, PA-C 05/08/18 2248    Little, Ambrose Finland, MD 05/08/18 [id number]

## 2018-10-22 ENCOUNTER — Ambulatory Visit (HOSPITAL_COMMUNITY)
Admission: EM | Admit: 2018-10-22 | Discharge: 2018-10-22 | Disposition: A | Discharge status: Home / Self Care | Payer: Medicaid Other | Attending: Internal Medicine | Admitting: Internal Medicine

## 2018-10-22 ENCOUNTER — Encounter (HOSPITAL_COMMUNITY): Payer: Self-pay | Admitting: Emergency Medicine

## 2018-10-22 ENCOUNTER — Other Ambulatory Visit: Payer: Self-pay

## 2018-10-22 ENCOUNTER — Other Ambulatory Visit: Payer: Self-pay | Admitting: Emergency Medicine

## 2018-10-22 DIAGNOSIS — O2341 Unspecified infection of urinary tract in pregnancy, first trimester: Secondary | ICD-10-CM | POA: Insufficient documentation

## 2018-10-22 DIAGNOSIS — N390 Urinary tract infection, site not specified: Secondary | ICD-10-CM | POA: Diagnosis present

## 2018-10-22 DIAGNOSIS — R319 Hematuria, unspecified: Secondary | ICD-10-CM | POA: Diagnosis present

## 2018-10-22 DIAGNOSIS — Z3A01 Less than 8 weeks gestation of pregnancy: Secondary | ICD-10-CM | POA: Diagnosis not present

## 2018-10-22 DIAGNOSIS — Z880 Allergy status to penicillin: Secondary | ICD-10-CM | POA: Diagnosis not present

## 2018-10-22 DIAGNOSIS — Z87891 Personal history of nicotine dependence: Secondary | ICD-10-CM | POA: Insufficient documentation

## 2018-10-22 DIAGNOSIS — Z3201 Encounter for pregnancy test, result positive: Secondary | ICD-10-CM

## 2018-10-22 LAB — POCT URINALYSIS DIP (DEVICE)
Bilirubin Urine: NEGATIVE
Glucose, UA: NEGATIVE mg/dL
Hgb urine dipstick: NEGATIVE
Nitrite: NEGATIVE
Protein, ur: 30 mg/dL — AB
Specific Gravity, Urine: 1.025 (ref 1.005–1.030)
Urobilinogen, UA: 2 mg/dL — ABNORMAL HIGH (ref 0.0–1.0)
pH: 6.5 (ref 5.0–8.0)

## 2018-10-22 LAB — POCT PREGNANCY, URINE: Preg Test, Ur: POSITIVE — AB

## 2018-10-22 MED ORDER — CEPHALEXIN 500 MG PO CAPS: 500.0000 mg | ORAL_CAPSULE | Freq: Two times a day (BID) | ORAL | 0 refills | Status: AC

## 2018-10-22 NOTE — ED Triage Notes (Signed)
Pt here with vaginal irritation x2 days. She would like to be tested for STD's and pregnancy.

## 2018-10-22 NOTE — Discharge Instructions (Addendum)
Your pregnancy test is positive.    Take the prescribed antibiotic for your urinary tract infection.    Start taking prenatal vitamins today.  Call your OB/GYN or the one suggested below to schedule an appointment.

## 2018-10-22 NOTE — ED Provider Notes (Signed)
[facility]    CSN: [id number] Arrival date & time: 10/22/18  1317      History   Chief Complaint Chief Complaint  Patient presents with  . SEXUALLY TRANSMITTED DISEASE  . Possible Pregnancy    HPI Kendra Fields is a 28 y.o. female.   Patient presents with vaginal irritation x2 days.  She denies vaginal discharge, pelvic pain, abdominal pain, dysuria, back pain, fever, or other symptoms.  She request a pregnancy test and testing for STDs.  She is sexually active and does not use condoms.  LMP: 09/17/2018.     The history is provided by the patient.    Past Medical History:  Diagnosis Date  . Chlamydia December 2016  . Pregnancy induced hypertension   . Preterm labor   . Trichomonas vaginitis December 2016    Patient Active Problem List   Diagnosis Date Noted  . Twin gestation, dichorionic diamniotic 05/07/2015    Past Surgical History:  Procedure Laterality Date  . NO PAST SURGERIES      OB History    Gravida  4   Para  3   Term  0   Preterm  3   AB  1   Living  2     SAB  0   TAB  0   Ectopic  0   Multiple  1   Live Births  2            Home Medications    Prior to Admission medications   Medication Sig Start Date End Date Taking? Authorizing Provider  cephALEXin (KEFLEX) 500 MG capsule Take 1 capsule (500 mg total) by mouth 2 (two) times daily for 5 days. 10/22/18 10/27/18  Mickie Bailate, Janda Cargo H, NP  HYDROcodone-acetaminophen (NORCO/VICODIN) 5-325 MG tablet Take 1 tablet by mouth every 6 (six) hours as needed for severe pain. 05/08/18   Cristina GongHammond, Elizabeth W, PA-C  methocarbamol (ROBAXIN) 500 MG tablet Take 1 tablet (500 mg total) by mouth 2 (two) times daily. 01/13/18   Hedges, Tinnie GensJeffrey, PA-C  ondansetron (ZOFRAN) 4 MG tablet Take 1 tablet (4 mg total) by mouth every 6 (six) hours. 08/21/17   Eustace MooreNelson, Yvonne Sue, MD    Family History Family History  Problem Relation Age of Onset  . Alcohol abuse Mother   . Asthma Brother   .  Stroke Maternal Uncle   . Cancer Maternal Grandmother        brain  . Diabetes Maternal Grandmother   . Diabetes Paternal Grandmother   . Hypertension Paternal Grandmother     Social History Social History   Tobacco Use  . Smoking status: Former Smoker    Types: Cigarettes  . Smokeless tobacco: Never Used  Substance Use Topics  . Alcohol use: Yes    Comment: occassionally  . Drug use: No     Allergies   Penicillins   Review of Systems Review of Systems  Constitutional: Negative for chills and fever.  HENT: Negative for ear pain and sore throat.   Eyes: Negative for pain and visual disturbance.  Respiratory: Negative for cough and shortness of breath.   Cardiovascular: Negative for chest pain and palpitations.  Gastrointestinal: Negative for abdominal pain, diarrhea, nausea and vomiting.  Genitourinary: Negative for dysuria, flank pain, hematuria, pelvic pain and vaginal discharge.  Musculoskeletal: Negative for arthralgias and back pain.  Skin: Negative for color change and rash.  Neurological: Negative for seizures and syncope.  All other systems reviewed and are negative.  Physical Exam Triage Vital Signs ED Triage Vitals  Enc Vitals Group     BP 10/22/18 1344 107/70     Pulse Rate 10/22/18 1344 82     Resp 10/22/18 1344 12     Temp 10/22/18 1344 99 F (37.2 C)     Temp Source 10/22/18 1344 Oral     SpO2 10/22/18 1344 100 %     Weight --      Height --      Head Circumference --      Peak Flow --      Pain Score 10/22/18 1342 0     Pain Loc --      Pain Edu? --      Excl. in GC? --    No data found.  Updated Vital Signs BP 107/70 (BP Location: Right Arm)   Pulse 82   Temp 99 F (37.2 C) (Oral)   Resp 12   LMP 10/18/2018 (Exact Date)   SpO2 100%   Visual Acuity Right Eye Distance:   Left Eye Distance:   Bilateral Distance:    Right Eye Near:   Left Eye Near:    Bilateral Near:     Physical Exam Vitals signs and nursing note  reviewed.  Constitutional:      General: She is not in acute distress.    Appearance: She is well-developed.  HENT:     Head: Normocephalic and atraumatic.     Mouth/Throat:     Mouth: Mucous membranes are moist.     Pharynx: Oropharynx is clear.  Eyes:     Conjunctiva/sclera: Conjunctivae normal.  Neck:     Musculoskeletal: Neck supple.  Cardiovascular:     Rate and Rhythm: Normal rate and regular rhythm.     Heart sounds: No murmur.  Pulmonary:     Effort: Pulmonary effort is normal. No respiratory distress.     Breath sounds: Normal breath sounds.  Abdominal:     General: Bowel sounds are normal.     Palpations: Abdomen is soft.     Tenderness: There is no abdominal tenderness. There is no right CVA tenderness, left CVA tenderness, guarding or rebound.  Skin:    General: Skin is warm and dry.  Neurological:     Mental Status: She is alert.      UC Treatments / Results  Labs (all labs ordered are listed, but only abnormal results are displayed) Labs Reviewed  POCT URINALYSIS DIP (DEVICE) - Abnormal; Notable for the following components:      Result Value   Ketones, ur TRACE (*)    Protein, ur 30 (*)    Urobilinogen, UA 2.0 (*)    Leukocytes,Ua LARGE (*)    All other components within normal limits  POCT PREGNANCY, URINE - Abnormal; Notable for the following components:   Preg Test, Ur POSITIVE (*)    All other components within normal limits  URINE CULTURE  CERVICOVAGINAL ANCILLARY ONLY    EKG   Radiology No results found.  Procedures Procedures (including critical care time)  Medications Ordered in UC Medications - No data to display  Initial Impression / Assessment and Plan / UC Course  I have reviewed the triage vital signs and the nursing notes.  Pertinent labs & imaging results that were available during my care of the patient were reviewed by me and considered in my medical decision making (see chart for details).   Pregnancy.  UTI.  Urine  pregnancy positive. Urine dip large  LE.  Vaginal swab for STDs.  Treating UTI with cephalexin; patient has an allergy to penicillin but states this was from childhood and she does not know the reaction.  Instructed patient to start taking prenatal vitamins today and to call her OB/GYN for an appointment.  Instructed patient to abstain from sex until her STD test results are back.  Discussed that she and her partner may need treatment at that time.  Patient agrees to plan of care.   Final Clinical Impressions(s) / UC Diagnoses   Final diagnoses:  Less than [redacted] weeks gestation of pregnancy  Urinary tract infection with hematuria, site unspecified     Discharge Instructions     Your pregnancy test is positive.    Take the prescribed antibiotic for your urinary tract infection.    Start taking prenatal vitamins today.  Call your OB/GYN or the one suggested below to schedule an appointment.         ED Prescriptions    Medication Sig Dispense Auth. Provider   cephALEXin (KEFLEX) 500 MG capsule Take 1 capsule (500 mg total) by mouth 2 (two) times daily for 5 days. 10 capsule Sharion Balloon, NP     PDMP not reviewed this encounter.   Sharion Balloon, NP 10/22/18 1416

## 2018-10-24 LAB — URINE CULTURE: Culture: 20000 — AB

## 2018-10-25 ENCOUNTER — Telehealth (HOSPITAL_COMMUNITY): Payer: Self-pay | Admitting: Emergency Medicine

## 2018-10-25 LAB — CERVICOVAGINAL ANCILLARY ONLY
Bacterial Vaginitis (gardnerella): POSITIVE — AB
Candida Glabrata: NEGATIVE
Candida Vaginitis: POSITIVE — AB
Molecular Disclaimer: NEGATIVE
Molecular Disclaimer: NEGATIVE
Molecular Disclaimer: NEGATIVE
Molecular Disclaimer: NORMAL
Trichomonas: NEGATIVE

## 2018-10-25 NOTE — Telephone Encounter (Signed)
Urine culture was positive for KLEBSIELLA PNEUMONIAE and was given keflex  at urgent care visit. Attempted to reach patient. No answer at this time.  

## 2018-10-26 LAB — CERVICOVAGINAL ANCILLARY ONLY
Chlamydia: NEGATIVE
Neisseria Gonorrhea: NEGATIVE

## 2018-10-27 ENCOUNTER — Other Ambulatory Visit: Payer: Self-pay

## 2018-10-27 ENCOUNTER — Encounter (HOSPITAL_COMMUNITY): Payer: Self-pay | Admitting: *Deleted

## 2018-10-27 ENCOUNTER — Inpatient Hospital Stay (HOSPITAL_COMMUNITY): Payer: Medicaid Other

## 2018-10-27 ENCOUNTER — Inpatient Hospital Stay (HOSPITAL_COMMUNITY)
Admission: AD | Admit: 2018-10-27 | Discharge: 2018-10-27 | Disposition: A | Discharge status: Home / Self Care | Payer: Medicaid Other | Attending: Obstetrics & Gynecology | Admitting: Obstetrics & Gynecology

## 2018-10-27 DIAGNOSIS — Z87891 Personal history of nicotine dependence: Secondary | ICD-10-CM | POA: Diagnosis not present

## 2018-10-27 DIAGNOSIS — O209 Hemorrhage in early pregnancy, unspecified: Secondary | ICD-10-CM | POA: Insufficient documentation

## 2018-10-27 DIAGNOSIS — Z3A01 Less than 8 weeks gestation of pregnancy: Secondary | ICD-10-CM | POA: Diagnosis not present

## 2018-10-27 DIAGNOSIS — O26851 Spotting complicating pregnancy, first trimester: Secondary | ICD-10-CM | POA: Diagnosis present

## 2018-10-27 DIAGNOSIS — Z3490 Encounter for supervision of normal pregnancy, unspecified, unspecified trimester: Secondary | ICD-10-CM

## 2018-10-27 DIAGNOSIS — Z880 Allergy status to penicillin: Secondary | ICD-10-CM | POA: Diagnosis not present

## 2018-10-27 DIAGNOSIS — Z6740 Type O blood, Rh positive: Secondary | ICD-10-CM

## 2018-10-27 LAB — URINALYSIS, ROUTINE W REFLEX MICROSCOPIC
Bacteria, UA: NONE SEEN
Bilirubin Urine: NEGATIVE
Glucose, UA: NEGATIVE mg/dL
Hgb urine dipstick: NEGATIVE
Ketones, ur: NEGATIVE mg/dL
Leukocytes,Ua: NEGATIVE
Nitrite: NEGATIVE
Protein, ur: 100 mg/dL — AB
Specific Gravity, Urine: 1.021 (ref 1.005–1.030)
pH: 6 (ref 5.0–8.0)

## 2018-10-27 LAB — CBC
HCT: 32.7 % — ABNORMAL LOW (ref 36.0–46.0)
Hemoglobin: 11.3 g/dL — ABNORMAL LOW (ref 12.0–15.0)
MCH: 28.5 pg (ref 26.0–34.0)
MCHC: 34.6 g/dL (ref 30.0–36.0)
MCV: 82.6 fL (ref 80.0–100.0)
Platelets: 275 10*3/uL (ref 150–400)
RBC: 3.96 MIL/uL (ref 3.87–5.11)
RDW: 14.2 % (ref 11.5–15.5)
WBC: 5.1 10*3/uL (ref 4.0–10.5)
nRBC: 0 % (ref 0.0–0.2)

## 2018-10-27 LAB — HCG, QUANTITATIVE, PREGNANCY: hCG, Beta Chain, Quant, S: 35503 m[IU]/mL — ABNORMAL HIGH (ref <5)

## 2018-10-27 NOTE — Discharge Instructions (Signed)
Center for [facility] Healthcare Prenatal Care Providers          Center for [facility] Healthcare @ [facility]   Phone: [phone]  Center for [facility] Healthcare @ [facility]   Phone: [phone]  Center For [facility]  [facility]       Phone: [phone]            Center for [facility] Healthcare @ [place]     Phone: [phone]          Center for [facility] Healthcare @ [employer]   Phone: [phone]  Center for [facility] Healthcare @ [facility]  Phone: [phone]     Center for [facility] Healthcare @ [facility] [address] [place])  Phone: [phone]  First Trimester of Pregnancy The first trimester of pregnancy is from week 1 until the end of week 13 (months 1 through 3). A week after a sperm fertilizes an egg, the egg will implant on the wall of the uterus. This embryo will begin to develop into a baby. Genes from you and your partner will form the baby. The female genes will determine whether the baby will be a boy or a girl. At 6-8 weeks, the eyes and face will be formed, and the heartbeat can be seen on ultrasound. At the end of 12 weeks, all the baby's organs will be formed. Now that you are pregnant, you will want to do everything you can to have a healthy baby. Two of the most important things are to get good prenatal care and to follow your health care provider's instructions. Prenatal care is all the medical care you receive before the baby's birth. This care will help prevent, find, and treat any problems during the pregnancy and childbirth. Body changes during your first trimester Your body goes through many changes during pregnancy. The changes vary from woman to woman.  You may gain or lose a couple of pounds at first.  You may feel sick to your stomach (nauseous) and you may throw up (vomit). If the vomiting is uncontrollable, call your health care provider.  You may tire easily.  You may develop headaches that can be relieved by medicines. All medicines should be approved by your  health care provider.  You may urinate more often. Painful urination may mean you have a bladder infection.  You may develop heartburn as a result of your pregnancy.  You may develop constipation because certain hormones are causing the muscles that push stool through your intestines to slow down.  You may develop hemorrhoids or swollen veins (varicose veins).  Your breasts may begin to grow larger and become tender. Your nipples may stick out more, and the tissue that surrounds them (areola) may become darker.  Your gums may bleed and may be sensitive to brushing and flossing.  Dark spots or blotches (chloasma, mask of pregnancy) may develop on your face. This will likely fade after the baby is born.  Your menstrual periods will stop.  You may have a loss of appetite.  You may develop cravings for certain kinds of food.  You may have changes in your emotions from day to day, such as being excited to be pregnant or being concerned that something may go wrong with the pregnancy and baby.  You may have more vivid and strange dreams.  You may have changes in your hair. These can include thickening of your hair, rapid growth, and changes in texture. Some women also have hair loss during or after pregnancy, or hair that feels dry  or thin. Your hair will most likely return to normal after your baby is born. What to expect at prenatal visits During a routine prenatal visit:  You will be weighed to make sure you and the baby are growing normally.  Your blood pressure will be taken.  Your abdomen will be measured to track your baby's growth.  The fetal heartbeat will be listened to between weeks 10 and 14 of your pregnancy.  Test results from any previous visits will be discussed. Your health care provider may ask you:  How you are feeling.  If you are feeling the baby move.  If you have had any abnormal symptoms, such as leaking fluid, bleeding, severe headaches, or abdominal  cramping.  If you are using any tobacco products, including cigarettes, chewing tobacco, and electronic cigarettes.  If you have any questions. Other tests that may be performed during your first trimester include:  Blood tests to find your blood type and to check for the presence of any previous infections. The tests will also be used to check for low iron levels (anemia) and protein on red blood cells (Rh antibodies). Depending on your risk factors, or if you previously had diabetes during pregnancy, you may have tests to check for high blood sugar that affects pregnant women (gestational diabetes).  Urine tests to check for infections, diabetes, or protein in the urine.  An ultrasound to confirm the proper growth and development of the baby.  Fetal screens for spinal cord problems (spina bifida) and Down syndrome.  HIV (human immunodeficiency virus) testing. Routine prenatal testing includes screening for HIV, unless you choose not to have this test.  You may need other tests to make sure you and the baby are doing well. Follow these instructions at home: Medicines  Follow your health care provider's instructions regarding medicine use. Specific medicines may be either safe or unsafe to take during pregnancy.  Take a prenatal vitamin that contains at least 600 micrograms (mcg) of folic acid.  If you develop constipation, try taking a stool softener if your health care provider approves. Eating and drinking   Eat a balanced diet that includes fresh fruits and vegetables, whole grains, good sources of protein such as meat, eggs, or tofu, and low-fat dairy. Your health care provider will help you determine the amount of weight gain that is right for you.  Avoid raw meat and uncooked cheese. These carry germs that can cause birth defects in the baby.  Eating four or five small meals rather than three large meals a day may help relieve nausea and vomiting. If you start to feel  nauseous, eating a few soda crackers can be helpful. Drinking liquids between meals, instead of during meals, also seems to help ease nausea and vomiting.  Limit foods that are high in fat and processed sugars, such as fried and sweet foods.  To prevent constipation: ? Eat foods that are high in fiber, such as fresh fruits and vegetables, whole grains, and beans. ? Drink enough fluid to keep your urine clear or pale yellow. Activity  Exercise only as directed by your health care provider. Most women can continue their usual exercise routine during pregnancy. Try to exercise for 30 minutes at least 5 days a week. Exercising will help you: ? Control your weight. ? Stay in shape. ? Be prepared for labor and delivery.  Experiencing pain or cramping in the lower abdomen or lower back is a good sign that you should stop exercising. Check  with your health care provider before continuing with normal exercises.  Try to avoid standing for long periods of time. Move your legs often if you must stand in one place for a long time.  Avoid heavy lifting.  Wear low-heeled shoes and practice good posture.  You may continue to have sex unless your health care provider tells you not to. Relieving pain and discomfort  Wear a good support bra to relieve breast tenderness.  Take warm sitz baths to soothe any pain or discomfort caused by hemorrhoids. Use hemorrhoid cream if your health care provider approves.  Rest with your legs elevated if you have leg cramps or low back pain.  If you develop varicose veins in your legs, wear support hose. Elevate your feet for 15 minutes, 3-4 times a day. Limit salt in your diet. Prenatal care  Schedule your prenatal visits by the twelfth week of pregnancy. They are usually scheduled monthly at first, then more often in the last 2 months before delivery.  Write down your questions. Take them to your prenatal visits.  Keep all your prenatal visits as told by your  health care provider. This is important. Safety  Wear your seat belt at all times when driving.  Make a list of emergency phone numbers, including numbers for family, friends, the hospital, and police and fire departments. General instructions  Ask your health care provider for a referral to a local prenatal education class. Begin classes no later than the beginning of month 6 of your pregnancy.  Ask for help if you have counseling or nutritional needs during pregnancy. Your health care provider can offer advice or refer you to specialists for help with various needs.  Do not use hot tubs, steam rooms, or saunas.  Do not douche or use tampons or scented sanitary pads.  Do not cross your legs for long periods of time.  Avoid cat litter boxes and soil used by cats. These carry germs that can cause birth defects in the baby and possibly loss of the fetus by miscarriage or stillbirth.  Avoid all smoking, herbs, alcohol, and medicines not prescribed by your health care provider. Chemicals in these products affect the formation and growth of the baby.  Do not use any products that contain nicotine or tobacco, such as cigarettes and e-cigarettes. If you need help quitting, ask your health care provider. You may receive counseling support and other resources to help you quit.  Schedule a dentist appointment. At home, brush your teeth with a soft toothbrush and be gentle when you floss. Contact a health care provider if:  You have dizziness.  You have mild pelvic cramps, pelvic pressure, or nagging pain in the abdominal area.  You have persistent nausea, vomiting, or diarrhea.  You have a bad smelling vaginal discharge.  You have pain when you urinate.  You notice increased swelling in your face, hands, legs, or ankles.  You are exposed to fifth disease or chickenpox.  You are exposed to [place] measles (rubella) and have never had it. Get help right away if:  You have a  fever.  You are leaking fluid from your vagina.  You have spotting or bleeding from your vagina.  You have severe abdominal cramping or pain.  You have rapid weight gain or loss.  You vomit blood or material that looks like coffee grounds.  You develop a severe headache.  You have shortness of breath.  You have any kind of trauma, such as from a fall  or a car accident. Summary  The first trimester of pregnancy is from week 1 until the end of week 13 (months 1 through 3).  Your body goes through many changes during pregnancy. The changes vary from woman to woman.  You will have routine prenatal visits. During those visits, your health care provider will examine you, discuss any test results you may have, and talk with you about how you are feeling. This information is not intended to replace advice given to you by your health care provider. Make sure you discuss any questions you have with your health care provider. Document Released: 01/07/2001 Document Revised: 12/26/2016 Document Reviewed: 12/26/2015 Elsevier Patient Education  2020 [employer].

## 2018-10-27 NOTE — MAU Note (Signed)
.   Kendra Fields is a 28 y.o. at [redacted]w[redacted]d here in MAU reporting: lower abdominal cramping for a week with light vaginal spotting since last night.  LMP: 09/16/18 Onset of complaint: ongoing for a week Pain score: 8 Vitals:   10/27/18 1324  BP: 117/70  Pulse: (!) 101  Resp: 16  Temp: 98.1 F (36.7 C)  SpO2: 99%     FHT: Lab orders placed from triage: UA

## 2018-10-27 NOTE — MAU Provider Note (Signed)
History     CSN: [id number]  Arrival date and time: 10/27/18 1246   First Provider Initiated Contact with Patient 10/27/18 1352      Chief Complaint  Patient presents with  . Vaginal Bleeding  . Abdominal Pain   Kendra Fields is a 28 y.o. [id number] at 6130w5d who presents today with spotting and pressure. She was seen recently at urgent care and diagnosed with UTI. She has just started the antibiotics for this.   Vaginal Bleeding The patient's primary symptoms include pelvic pain and vaginal bleeding. This is a new problem. The current episode started yesterday. The problem occurs intermittently. The problem has been unchanged. The problem affects both sides. She is pregnant. Associated symptoms include back pain and nausea. Pertinent negatives include no chills, dysuria, fever, frequency, urgency or vomiting. The vaginal discharge was bloody. The vaginal bleeding is spotting. She has not been passing clots. She has not been passing tissue. She has tried nothing for the symptoms. Sexual activity: denies intercourse in the last 24 hours.  She uses nothing for contraception. Her menstrual history has been regular (LMP 09/17/2018).    OB History    Gravida  5   Para  3   Term  0   Preterm  3   AB  1   Living  2     SAB  0   TAB  0   Ectopic  0   Multiple  1   Live Births  2           Past Medical History:  Diagnosis Date  . Chlamydia December 2016  . Pregnancy induced hypertension   . Preterm labor   . Trichomonas vaginitis December 2016    Past Surgical History:  Procedure Laterality Date  . NO PAST SURGERIES      Family History  Problem Relation Age of Onset  . Alcohol abuse Mother   . Asthma Brother   . Stroke Maternal Uncle   . Cancer Maternal Grandmother        brain  . Diabetes Maternal Grandmother   . Diabetes Paternal Grandmother   . Hypertension Paternal Grandmother     Social History   Tobacco Use  . Smoking status: Former Smoker   Types: Cigarettes  . Smokeless tobacco: Never Used  Substance Use Topics  . Alcohol use: Yes    Comment: occassionally  . Drug use: No    Allergies:  Allergies  Allergen Reactions  . Penicillins Other (See Comments)    Childhood reaction---unknown    Medications Prior to Admission  Medication Sig Dispense Refill Last Dose  . Prenatal Vit-Fe Fumarate-FA (PRENATAL MULTIVITAMIN) TABS tablet Take 1 tablet by mouth daily at 12 noon.   10/26/2018 at 0930  . cephALEXin (KEFLEX) 500 MG capsule Take 1 capsule (500 mg total) by mouth 2 (two) times daily for 5 days. 10 capsule 0 10/19/2018 at 0930  . HYDROcodone-acetaminophen (NORCO/VICODIN) 5-325 MG tablet Take 1 tablet by mouth every 6 (six) hours as needed for severe pain. 3 tablet 0   . methocarbamol (ROBAXIN) 500 MG tablet Take 1 tablet (500 mg total) by mouth 2 (two) times daily. 20 tablet 0   . ondansetron (ZOFRAN) 4 MG tablet Take 1 tablet (4 mg total) by mouth every 6 (six) hours. 12 tablet 0     Review of Systems  Constitutional: Negative for chills and fever.  Gastrointestinal: Positive for nausea. Negative for vomiting.  Genitourinary: Positive for pelvic pain and vaginal bleeding.  Negative for dysuria, frequency and urgency.  Musculoskeletal: Positive for back pain.   Physical Exam   Blood pressure 117/70, pulse (!) 101, temperature 98.1 F (36.7 C), resp. rate 16, last menstrual period 09/17/2018, SpO2 99 %, unknown if currently breastfeeding.  Physical Exam  Nursing note and vitals reviewed. Constitutional: She is oriented to person, place, and time. She appears well-developed and well-nourished. No distress.  HENT:  Head: Normocephalic.  Cardiovascular: Normal rate.  Respiratory: Effort normal.  GI: Soft. There is no abdominal tenderness. There is no rebound.  Neurological: She is alert and oriented to person, place, and time.  Skin: Skin is warm and dry.  Psychiatric: She has a normal mood and affect.   Results for  orders placed or performed during the hospital encounter of 10/27/18 (from the past 24 hour(s))  Urinalysis, Routine w reflex microscopic     Status: Abnormal   Collection Time: 10/27/18  1:34 PM  Result Value Ref Range   Color, Urine YELLOW YELLOW   APPearance CLEAR CLEAR   Specific Gravity, Urine 1.021 1.005 - 1.030   pH 6.0 5.0 - 8.0   Glucose, UA NEGATIVE NEGATIVE mg/dL   Hgb urine dipstick NEGATIVE NEGATIVE   Bilirubin Urine NEGATIVE NEGATIVE   Ketones, ur NEGATIVE NEGATIVE mg/dL   Protein, ur 626 (A) NEGATIVE mg/dL   Nitrite NEGATIVE NEGATIVE   Leukocytes,Ua NEGATIVE NEGATIVE   RBC / HPF 0-5 0 - 5 RBC/hpf   WBC, UA 0-5 0 - 5 WBC/hpf   Bacteria, UA NONE SEEN NONE SEEN   Squamous Epithelial / LPF 0-5 0 - 5   Mucus PRESENT   CBC     Status: Abnormal   Collection Time: 10/27/18  2:08 PM  Result Value Ref Range   WBC 5.1 4.0 - 10.5 K/uL   RBC 3.96 3.87 - 5.11 MIL/uL   Hemoglobin 11.3 (L) 12.0 - 15.0 g/dL   HCT 94.8 (L) 54.6 - 27.0 %   MCV 82.6 80.0 - 100.0 fL   MCH 28.5 26.0 - 34.0 pg   MCHC 34.6 30.0 - 36.0 g/dL   RDW 35.0 09.3 - 81.8 %   Platelets 275 150 - 400 K/uL   nRBC 0.0 0.0 - 0.2 %  hCG, quantitative, pregnancy     Status: Abnormal   Collection Time: 10/27/18  2:08 PM  Result Value Ref Range   hCG, Beta Chain, Quant, S 35,503 (H) <5 mIU/mL   US Ob Less Than 14 Weeks With Ob Transvaginal  Result Date: 10/27/2018 CLINICAL DATA:  Vaginal bleeding EXAM: OBSTETRIC <14 WK [place] AND TRANSVAGINAL OB US TECHNIQUE: Both transabdominal and transvaginal ultrasound examinations were performed for complete evaluation of the gestation as well as the maternal uterus, adnexal regions, and pelvic cul-de-sac. Transvaginal technique was performed to assess early pregnancy. COMPARISON:  None. FINDINGS: Intrauterine gestational sac: Single Yolk sac:  Present Embryo:  Present Cardiac Activity: Present Heart Rate: 98 bpm CRL:  2.1 mm mm   5 w   5 d                  [place] EDC: 06/24/2019  Subchorionic hemorrhage:  None visualized. Maternal uterus/adnexae: Minimal free fluid is noted. No other focal abnormality is noted. IMPRESSION: Single live intrauterine gestation at 5 weeks 5 days. Continued follow-up can be performed as clinically indicated. Electronically Signed   By: Alcide Clever M.D.   On: 10/27/2018 15:00    MAU Course  Procedures  MDM   Assessment and Plan  1. Intrauterine pregnancy   2. Vaginal bleeding in pregnancy, first trimester   3. [redacted] weeks gestation of pregnancy   4. Type O blood, Rh positive    DC home Comfort measures reviewed  1st Trimester precautions  Bleeding precautions RX: none Return to MAU as needed FU with OB as planned  Follow-up Information    Department, [facility] Follow up.   Contact information: [place] [id number] [address] DNP, CNM  10/27/18  3:43 PM

## 2018-10-28 ENCOUNTER — Telehealth (HOSPITAL_COMMUNITY): Payer: Self-pay | Admitting: Emergency Medicine

## 2018-10-28 MED ORDER — TERCONAZOLE 0.4 % VA CREA: 1.0000 | TOPICAL_CREAM | Freq: Every day | VAGINAL | 0 refills | Status: AC

## 2018-10-28 MED ORDER — CLINDAMYCIN HCL 300 MG PO CAPS: 300.0000 mg | ORAL_CAPSULE | Freq: Two times a day (BID) | ORAL | 0 refills | Status: AC

## 2018-10-28 NOTE — Telephone Encounter (Signed)
Bacterial vaginosis is positive. This was not treated at the urgent care visit.  Clindamycin per Dr. Mannie Stabile refills sent to patients pharmacy of choice.    Test for candida (yeast) was positive.  Prescription terconazole, sent to the pharmacy of record.  Recheck or followup with PCP for further evaluation if symptoms are not improving.    Patient contacted and made aware of    results, all questions answered

## 2018-12-30 ENCOUNTER — Other Ambulatory Visit: Payer: Self-pay

## 2018-12-30 ENCOUNTER — Ambulatory Visit (INDEPENDENT_AMBULATORY_CARE_PROVIDER_SITE_OTHER): Payer: Medicaid Other

## 2018-12-30 DIAGNOSIS — Z3480 Encounter for supervision of other normal pregnancy, unspecified trimester: Secondary | ICD-10-CM | POA: Insufficient documentation

## 2018-12-30 DIAGNOSIS — Z3482 Encounter for supervision of other normal pregnancy, second trimester: Secondary | ICD-10-CM

## 2018-12-30 DIAGNOSIS — Z3A14 14 weeks gestation of pregnancy: Secondary | ICD-10-CM

## 2018-12-30 MED ORDER — BLOOD PRESSURE KIT DEVI: 1.0000 | 0 refills | Status: DC

## 2018-12-30 NOTE — Progress Notes (Addendum)
I connected with  Kendra Fields on 12/30/18 by a video enabled telemedicine application and verified that I am speaking with the correct person using two identifiers.   I discussed the limitations of evaluation and management by telemedicine. The patient expressed understanding and agreed to proceed.   PRENATAL INTAKE SUMMARY  Kendra Fields presents today New OB Nurse Interview.  OB History    Gravida  5   Para  3   Term  0   Preterm  3   AB  1   Living  2     SAB  0   TAB  0   Ectopic  0   Multiple  1   Live Births  2          I have reviewed the patient's medical, obstetrical, social, and family histories, medications, and available lab results.  SUBJECTIVE She has no unusual complaints  OBJECTIVE Initial Physical Exam (New OB)  GENERAL APPEARANCE: alert, well appearing, oriented to person, place and time   ASSESSMENT Normal pregnancy  PLAN Prenatal care at [facility] BP Cuff  Babyscripts App

## 2019-01-07 ENCOUNTER — Encounter: Payer: Self-pay | Admitting: Obstetrics

## 2019-01-28 NOTE — L&D Delivery Note (Signed)
OB/GYN Faculty Practice Delivery Note  Kendra Fields is a 29 y.o. [id number] s/p NSVD at [redacted]w[redacted]d. She was admitted for IOL for history of two prior IUFD.   ROM: 2h 33m with clear fluid GBS Status:  Negative/-- (04/29 0329) Maximum Maternal Temperature: 98.4 F    Labor Progress: . Patient arrived at 1 cm dilation and was induced with foley bulb, misoprostol x2, pitocin, and AROM, and progressed to NSVD over approximately 8 hours.   Delivery Date/Time: 06/03/2019 at 2038 Delivery: Called to room and patient was complete and pushing. Patient was given 1g TXA prior to delivery due to hgb 8.1 present on admission. Head delivered in OA position. No nuchal cord present, but body cord present. Shoulder and body delivered in usual fashion. Infant with spontaneous cry, placed on mother's abdomen, dried and stimulated. Cord clamped x 2 after 1-minute delay, and cut by infant's grandmother. Cord blood drawn. Placenta delivered spontaneously with gentle cord traction. Fundus firm with massage and Pitocin. Labia, perineum, vagina, and cervix inspected with 2nd degree perineal laceration repaired in usual fashion with 2-0 and 3-0 Vicryl rapide, and hemostatic L periuretheral not repaired.   Placenta: 3v intact to L&D Complications: none Lacerations: 2nd degree perineal laceration repaired in usual fashion with 2-0 and 3-0 Vicryl rapide, and hemostatic L periuretheral not repaired EBL: 542 cc Analgesia: none   Infant: APGAR (1 MIN): 8   APGAR (5 MINS): 9    Weight: 2611 grams  Zack Seal, MD/[facility] OB/GYN Fellow, Faculty Practice

## 2019-02-01 ENCOUNTER — Other Ambulatory Visit: Payer: Self-pay

## 2019-02-01 ENCOUNTER — Encounter: Payer: Self-pay | Admitting: Obstetrics

## 2019-02-01 ENCOUNTER — Ambulatory Visit (INDEPENDENT_AMBULATORY_CARE_PROVIDER_SITE_OTHER): Payer: Medicaid Other | Admitting: Obstetrics

## 2019-02-01 ENCOUNTER — Other Ambulatory Visit (HOSPITAL_COMMUNITY)
Admission: RE | Admit: 2019-02-01 | Discharge: 2019-02-01 | Disposition: A | Discharge status: Home / Self Care | Payer: Medicaid Other | Source: Ambulatory Visit | Attending: Obstetrics | Admitting: Obstetrics

## 2019-02-01 VITALS — BP 107/72 | HR 97 | Wt 122.0 lb

## 2019-02-01 DIAGNOSIS — O0992 Supervision of high risk pregnancy, unspecified, second trimester: Secondary | ICD-10-CM | POA: Diagnosis not present

## 2019-02-01 DIAGNOSIS — Z3A19 19 weeks gestation of pregnancy: Secondary | ICD-10-CM

## 2019-02-01 DIAGNOSIS — O0990 Supervision of high risk pregnancy, unspecified, unspecified trimester: Secondary | ICD-10-CM | POA: Diagnosis present

## 2019-02-01 DIAGNOSIS — Z8759 Personal history of other complications of pregnancy, childbirth and the puerperium: Secondary | ICD-10-CM

## 2019-02-01 DIAGNOSIS — O09899 Supervision of other high risk pregnancies, unspecified trimester: Secondary | ICD-10-CM

## 2019-02-01 MED ORDER — ASPIRIN 81 MG PO CHEW: 162.0000 mg | CHEWABLE_TABLET | Freq: Every day | ORAL | 5 refills | Status: DC

## 2019-02-01 MED ORDER — HYDROXYPROGESTERONE CAPROATE 275 MG/1.1ML ~~LOC~~ SOAJ
275.0000 mg | SUBCUTANEOUS | Status: DC
  Administered 2019-02-09 – 2019-05-26 (×12): 275 mg via SUBCUTANEOUS

## 2019-02-01 MED ORDER — ASPIRIN 81 MG PO CHEW: 162.0000 mg | CHEWABLE_TABLET | Freq: Once | ORAL | Status: DC

## 2019-02-01 MED ORDER — PRENATE MINI 29-0.6-0.4-350 MG PO CAPS: 1.0000 | ORAL_CAPSULE | Freq: Every day | ORAL | 3 refills | Status: DC

## 2019-02-01 NOTE — Progress Notes (Signed)
Subjective:    Kendra Fields is being seen today for her first obstetrical visit.  This is not a planned pregnancy. She is at [redacted]w[redacted]d gestation. Her obstetrical history is significant for history of 2 3rd trimester IUFD's, pre-eclampsia and preterm deliveries. Relationship with FOB: unknown. Patient does intend to breast feed. Pregnancy history fully reviewed.  The information documented in the HPI was reviewed and verified.  Menstrual History: OB History    Gravida  5   Para  3   Term  0   Preterm  3   AB  1   Living  2     SAB  0   TAB  0   Ectopic  0   Multiple  1   Live Births  2            Patient's last menstrual period was 09/17/2018.    Past Medical History:  Diagnosis Date  . Chlamydia December 2016  . Pregnancy induced hypertension   . Preterm labor   . Trichomonas vaginitis December 2016    Past Surgical History:  Procedure Laterality Date  . NO PAST SURGERIES      (Not in a hospital admission)  Allergies  Allergen Reactions  . Penicillins Other (See Comments)    Childhood reaction---unknown    Social History   Tobacco Use  . Smoking status: Former Smoker    Types: Cigarettes  . Smokeless tobacco: Never Used  Substance Use Topics  . Alcohol use: Not Currently    Comment: occassionally    Family History  Problem Relation Age of Onset  . Alcohol abuse Mother   . Asthma Brother   . Stroke Maternal Uncle   . Cancer Maternal Grandmother        brain  . Diabetes Maternal Grandmother   . Diabetes Paternal Grandmother   . Hypertension Paternal Grandmother      Review of Systems Constitutional: negative for weight loss Gastrointestinal: negative for vomiting Genitourinary:negative for genital lesions and vaginal discharge and dysuria Musculoskeletal:negative for back pain Behavioral/Psych: negative for abusive relationship, depression, illegal drug usage and tobacco use    Objective:    BP 107/72   Pulse 97   Wt 122 lb (55.3  kg)   LMP 09/17/2018   BMI 20.30 kg/m  General Appearance:    Alert, cooperative, no distress, appears stated age  Head:    Normocephalic, without obvious abnormality, atraumatic  Eyes:    PERRL, conjunctiva/corneas clear, EOM's intact, fundi    benign, both eyes  Ears:    Normal TM's and external ear canals, both ears  Nose:   Nares normal, septum midline, mucosa normal, no drainage    or sinus tenderness  Throat:   Lips, mucosa, and tongue normal; teeth and gums normal  Neck:   Supple, symmetrical, trachea midline, no adenopathy;    thyroid:  no enlargement/tenderness/nodules; no carotid   bruit or JVD  Back:     Symmetric, no curvature, ROM normal, no CVA tenderness  Lungs:     Clear to auscultation bilaterally, respirations unlabored  Chest Wall:    No tenderness or deformity   Heart:    Regular rate and rhythm, S1 and S2 normal, no murmur, rub   or gallop  Breast Exam:    No tenderness, masses, or nipple abnormality  Abdomen:     Soft, non-tender, bowel sounds active all four quadrants,    no masses, no organomegaly  Genitalia:    Normal female without lesion,  discharge or tenderness  Extremities:   Extremities normal, atraumatic, no cyanosis or edema  Pulses:   2+ and symmetric all extremities  Skin:   Skin color, texture, turgor normal, no rashes or lesions  Lymph nodes:   Cervical, supraclavicular, and axillary nodes normal  Neurologic:   CNII-XII intact, normal strength, sensation and reflexes    throughout      Lab Review Urine pregnancy test Labs reviewed yes Radiologic studies reviewed yes  Assessment:    Pregnancy at [redacted]w[redacted]d weeks    Plan:     1. Supervision of high risk pregnancy, antepartum Rx: - AMB referral to maternal fetal medicine - [place] MFM OB DETAIL +14 WK; Future - Prenat w/o A-FeCbn-Meth-FA-DHA (PRENATE MINI) 29-0.6-0.4-350 MG CAPS; Take 1 capsule by mouth daily before breakfast.  Dispense: 90 capsule; Refill: 3 - Obstetric Panel, Including HIV -  Culture, OB Urine - Genetic Screening - Cytology - PAP( [place]) - Cervicovaginal ancillary only( [place]) - AFP, Serum, Open Spina Bifida  2. History of preterm delivery, currently pregnant Rx: - HYDROXYprogesterone Caproate SOAJ 275 mg  3. History of IUFD x 2 in the 3rd trimester - referred to MFM for fetal surveillance  4. History of pre-eclampsia Rx: - aspirin chewable tablet 162 mg daily until delivery   Prenatal vitamins.  Counseling provided regarding continued use of seat belts, cessation of alcohol consumption, smoking or use of illicit drugs; infection precautions i.e., influenza/TDAP immunizations, toxoplasmosis,CMV, parvovirus, listeria and varicella; workplace safety, exercise during pregnancy; routine dental care, safe medications, sexual activity, hot tubs, saunas, pools, travel, caffeine use, fish and methlymercury, potential toxins, hair treatments, varicose veins Weight gain recommendations per IOM guidelines reviewed: underweight/BMI< 18.5--> gain 28 - 40 lbs; normal weight/BMI 18.5 - 24.9--> gain 25 - 35 lbs; overweight/BMI 25 - 29.9--> gain 15 - 25 lbs; obese/BMI >30->gain  11 - 20 lbs Problem list reviewed and updated. FIRST/CF mutation testing/NIPT/QUAD SCREEN/fragile X/Ashkenazi Jewish population testing/Spinal muscular atrophy discussed: requested. Role of ultrasound in pregnancy discussed; fetal survey: requested. Amniocentesis discussed: not indicated.  Meds ordered this encounter  Medications  . aspirin chewable tablet 162 mg  . HYDROXYprogesterone Caproate SOAJ 275 mg  . Prenat w/o A-FeCbn-Meth-FA-DHA (PRENATE MINI) 29-0.6-0.4-350 MG CAPS    Sig: Take 1 capsule by mouth daily before breakfast.    Dispense:  90 capsule    Refill:  3   Orders Placed This Encounter  Procedures  . Culture, OB Urine  . [place] MFM OB DETAIL +14 WK    Standing Status:   Future    Standing Expiration Date:   03/31/2020    Order Specific Question:   Reason for Exam  (SYMPTOM  OR DIAGNOSIS REQUIRED)    Answer:   Two 3rd trimester IUFD's.  History of PTL.    Order Specific Question:   Preferred Location    Answer:   [facility]  . Obstetric Panel, Including HIV  . Genetic Screening  . AFP, Serum, Open Spina Bifida    Order Specific Question:   Is patient insulin dependent?    Answer:   No    Order Specific Question:   Weight (lbs)    Answer:   83    Order Specific Question:   Gestational Age (GA), weeks    Answer:   19.4    Order Specific Question:   Date on which patient was at this GA    Answer:   02/01/2019    Order  Specific Question:   GA Calculation Method    Answer:   LMP    Order Specific Question:   GA Date    Answer:   06/24/2019    Order Specific Question:   Number of fetuses    Answer:   1  . AMB referral to maternal fetal medicine    Referral Priority:   Routine    Referral Type:   Consultation    Referral Reason:   Specialty Services Required    Number of Visits Requested:   1    Follow up in 1 weeks. 50% of 25 min visit spent on counseling and coordination of care.    Shelly Bombard, MD 02/01/2019 11:40 AM

## 2019-02-02 ENCOUNTER — Other Ambulatory Visit: Payer: Self-pay | Admitting: Obstetrics

## 2019-02-02 DIAGNOSIS — N760 Acute vaginitis: Secondary | ICD-10-CM

## 2019-02-02 DIAGNOSIS — B9689 Other specified bacterial agents as the cause of diseases classified elsewhere: Secondary | ICD-10-CM

## 2019-02-02 LAB — CERVICOVAGINAL ANCILLARY ONLY
Bacterial Vaginitis (gardnerella): POSITIVE — AB
Candida Glabrata: NEGATIVE
Candida Vaginitis: NEGATIVE
Chlamydia: NEGATIVE
Comment: NEGATIVE
Comment: NEGATIVE
Comment: NEGATIVE
Comment: NEGATIVE
Comment: NEGATIVE
Comment: NORMAL
Neisseria Gonorrhea: NEGATIVE
Trichomonas: NEGATIVE

## 2019-02-02 MED ORDER — TINIDAZOLE 500 MG PO TABS: 1000.0000 mg | ORAL_TABLET | Freq: Every day | ORAL | 2 refills | Status: DC

## 2019-02-03 LAB — CYTOLOGY - PAP
Comment: NEGATIVE
Diagnosis: NEGATIVE
High risk HPV: NEGATIVE

## 2019-02-04 LAB — OBSTETRIC PANEL, INCLUDING HIV
Basophils Absolute: 0 10*3/uL (ref 0.0–0.2)
Basos: 1 %
EOS (ABSOLUTE): 0.1 10*3/uL (ref 0.0–0.4)
Eos: 1 %
HIV Screen 4th Generation wRfx: NONREACTIVE
Hematocrit: 26.6 % — ABNORMAL LOW (ref 34.0–46.6)
Hemoglobin: 9.1 g/dL — ABNORMAL LOW (ref 11.1–15.9)
Hepatitis B Surface Ag: NEGATIVE
Immature Grans (Abs): 0 10*3/uL (ref 0.0–0.1)
Immature Granulocytes: 0 %
Lymphocytes Absolute: 1.6 10*3/uL (ref 0.7–3.1)
Lymphs: 28 %
MCH: 28.7 pg (ref 26.6–33.0)
MCHC: 34.2 g/dL (ref 31.5–35.7)
MCV: 84 fL (ref 79–97)
Monocytes Absolute: 0.6 10*3/uL (ref 0.1–0.9)
Monocytes: 10 %
Neutrophils Absolute: 3.3 10*3/uL (ref 1.4–7.0)
Neutrophils: 60 %
Platelets: 308 10*3/uL (ref 150–450)
RBC: 3.17 x10E6/uL — ABNORMAL LOW (ref 3.77–5.28)
RDW: 13.7 % (ref 11.7–15.4)
RPR Ser Ql: NONREACTIVE
Rh Factor: POSITIVE
Rubella Antibodies, IGG: 1.91 index (ref >0.99)
WBC: 5.5 10*3/uL (ref 3.4–10.8)

## 2019-02-04 LAB — AFP, SERUM, OPEN SPINA BIFIDA
AFP MoM: 1.45
AFP Value: 98.8 ng/mL
Gest. Age on Collection Date: 19.6 weeks
Maternal Age At EDD: 31 yr
OSBR Risk 1 IN: 6265
Test Results:: NEGATIVE
Weight: 122 [lb_av]

## 2019-02-04 LAB — AB SCR+ANTIBODY ID: Antibody Screen: POSITIVE — AB

## 2019-02-04 NOTE — Progress Notes (Signed)
Patient made aware of results  Pt voiced understanding.

## 2019-02-06 LAB — URINE CULTURE, OB REFLEX

## 2019-02-06 LAB — CULTURE, OB URINE

## 2019-02-08 ENCOUNTER — Other Ambulatory Visit: Payer: Self-pay | Admitting: Obstetrics

## 2019-02-08 DIAGNOSIS — R768 Other specified abnormal immunological findings in serum: Secondary | ICD-10-CM

## 2019-02-09 ENCOUNTER — Encounter: Payer: Self-pay | Admitting: Obstetrics & Gynecology

## 2019-02-09 ENCOUNTER — Other Ambulatory Visit: Payer: Self-pay

## 2019-02-09 ENCOUNTER — Encounter: Payer: Self-pay | Admitting: Obstetrics

## 2019-02-09 ENCOUNTER — Other Ambulatory Visit: Payer: Self-pay | Admitting: *Deleted

## 2019-02-09 ENCOUNTER — Ambulatory Visit (INDEPENDENT_AMBULATORY_CARE_PROVIDER_SITE_OTHER): Payer: Medicaid Other | Admitting: Obstetrics & Gynecology

## 2019-02-09 VITALS — BP 99/65 | HR 73 | Wt 121.0 lb

## 2019-02-09 DIAGNOSIS — Z3A20 20 weeks gestation of pregnancy: Secondary | ICD-10-CM

## 2019-02-09 DIAGNOSIS — O09212 Supervision of pregnancy with history of pre-term labor, second trimester: Secondary | ICD-10-CM | POA: Diagnosis not present

## 2019-02-09 DIAGNOSIS — O0990 Supervision of high risk pregnancy, unspecified, unspecified trimester: Secondary | ICD-10-CM

## 2019-02-09 DIAGNOSIS — Z8759 Personal history of other complications of pregnancy, childbirth and the puerperium: Secondary | ICD-10-CM | POA: Insufficient documentation

## 2019-02-09 DIAGNOSIS — O09899 Supervision of other high risk pregnancies, unspecified trimester: Secondary | ICD-10-CM

## 2019-02-09 DIAGNOSIS — R768 Other specified abnormal immunological findings in serum: Secondary | ICD-10-CM

## 2019-02-09 NOTE — Progress Notes (Signed)
   PRENATAL VISIT NOTE  Subjective:  Kendra Fields is a 29 y.o. [id number] at [redacted]w[redacted]d being seen today for ongoing prenatal care.  She is currently monitored for the following issues for this high-risk pregnancy and has History of pre-eclampsia; History of preterm delivery, currently pregnant; and Supervision of high risk pregnancy, antepartum on their problem list.  Patient reports no complaints.  Contractions: Not present. Vag. Bleeding: None.  Movement: Present. Denies leaking of fluid.   The following portions of the patient's history were reviewed and updated as appropriate: allergies, current medications, past family history, past medical history, past social history, past surgical history and problem list.   Objective:   Vitals:   02/09/19 1035  BP: 99/65  Pulse: 73  Weight: 121 lb (54.9 kg)    Fetal Status: Fetal Heart Rate (bpm): 150   Movement: Present     General:  Alert, oriented and cooperative. Patient is in no acute distress.  Skin: Skin is warm and dry. No rash noted.   Cardiovascular: Normal heart rate noted  Respiratory: Normal respiratory effort, no problems with respiration noted  Abdomen: Soft, gravid, appropriate for gestational age.  Pain/Pressure: Absent     Pelvic: Cervical exam deferred        Extremities: Normal range of motion.     Mental Status: Normal mood and affect. Normal behavior. Normal judgment and thought content.   Assessment and Plan:  Pregnancy: [id number] at [redacted]w[redacted]d 1. Supervision of high risk pregnancy, antepartum   Preterm labor symptoms and general obstetric precautions including but not limited to vaginal bleeding, contractions, leaking of fluid and fetal movement were reviewed in detail with the patient. Please refer to After Visit Summary for other counseling recommendations.   Return in about 4 weeks (around 03/09/2019) for needs makena weekly.  Future Appointments  Date Time Provider Department Center  02/16/2019  1:30 PM [facility] NURSE  [facility] None  02/17/2019  1:30 PM [facility] NURSE [facility]  02/17/2019  1:30 PM [facility] [place] 1 [facility]  02/17/2019  2:30 PM [facility] MD RM [facility]  02/23/2019  1:15 PM [facility] NURSE [facility] None  03/02/2019  1:45 PM [facility] NURSE [facility] None  03/09/2019  1:30 PM Adam Phenix, MD [facility] None    Scheryl Darter, MD

## 2019-02-09 NOTE — Patient Instructions (Signed)

## 2019-02-09 NOTE — Progress Notes (Signed)
Orders for MFM changed per their request.

## 2019-02-14 ENCOUNTER — Encounter (HOSPITAL_COMMUNITY): Payer: Self-pay

## 2019-02-16 ENCOUNTER — Ambulatory Visit (INDEPENDENT_AMBULATORY_CARE_PROVIDER_SITE_OTHER): Payer: Medicaid Other

## 2019-02-16 ENCOUNTER — Encounter: Payer: Self-pay | Admitting: Obstetrics

## 2019-02-16 ENCOUNTER — Other Ambulatory Visit: Payer: Self-pay

## 2019-02-16 DIAGNOSIS — Z3A21 21 weeks gestation of pregnancy: Secondary | ICD-10-CM

## 2019-02-16 DIAGNOSIS — O09212 Supervision of pregnancy with history of pre-term labor, second trimester: Secondary | ICD-10-CM

## 2019-02-16 DIAGNOSIS — O09899 Supervision of other high risk pregnancies, unspecified trimester: Secondary | ICD-10-CM

## 2019-02-16 MED ORDER — HYDROXYPROGESTERONE CAPROATE 275 MG/1.1ML ~~LOC~~ SOAJ
275.0000 mg | Freq: Once | SUBCUTANEOUS | Status: AC
  Administered 2019-02-16: 14:00:00 275 mg via SUBCUTANEOUS

## 2019-02-16 NOTE — Progress Notes (Signed)
Pt is here for her weekly 17-p injection. Pt tolerated injection well in the left arm without any complications. Pt advised -EH/RMA

## 2019-02-17 ENCOUNTER — Ambulatory Visit (HOSPITAL_COMMUNITY): Payer: Medicaid Other | Admitting: *Deleted

## 2019-02-17 ENCOUNTER — Ambulatory Visit (HOSPITAL_COMMUNITY)
Admission: RE | Admit: 2019-02-17 | Discharge: 2019-02-17 | Disposition: A | Discharge status: Home / Self Care | Payer: Medicaid Other | Source: Ambulatory Visit | Attending: Obstetrics and Gynecology | Admitting: Obstetrics and Gynecology

## 2019-02-17 ENCOUNTER — Other Ambulatory Visit: Payer: Self-pay | Admitting: Obstetrics

## 2019-02-17 ENCOUNTER — Other Ambulatory Visit (HOSPITAL_COMMUNITY): Payer: Self-pay | Admitting: *Deleted

## 2019-02-17 ENCOUNTER — Encounter (HOSPITAL_COMMUNITY): Payer: Self-pay

## 2019-02-17 ENCOUNTER — Ambulatory Visit (HOSPITAL_COMMUNITY): Payer: Medicaid Other

## 2019-02-17 DIAGNOSIS — Z8759 Personal history of other complications of pregnancy, childbirth and the puerperium: Secondary | ICD-10-CM

## 2019-02-17 DIAGNOSIS — O0990 Supervision of high risk pregnancy, unspecified, unspecified trimester: Secondary | ICD-10-CM

## 2019-02-17 DIAGNOSIS — Z3A21 21 weeks gestation of pregnancy: Secondary | ICD-10-CM | POA: Insufficient documentation

## 2019-02-17 DIAGNOSIS — O09212 Supervision of pregnancy with history of pre-term labor, second trimester: Secondary | ICD-10-CM | POA: Diagnosis not present

## 2019-02-17 DIAGNOSIS — O09292 Supervision of pregnancy with other poor reproductive or obstetric history, second trimester: Secondary | ICD-10-CM | POA: Insufficient documentation

## 2019-02-17 NOTE — Consult Note (Signed)
Maternal-Fetal Medicine   Name: Kendra Fields MRN: [id number] Requesting Provider: Baltazar Najjar, MD  I had the pleasure of seeing [name] today at the [facility] for maternal fetal care.  She is here for fetal anatomy scan and consultation.  Obstetric history significant for 2 previous stillbirths followed by a preterm delivery of twins.  Obstetric history 1. 01/2007: Patient reports her pregnancy was uneventful till around [redacted] weeks gestation.  At her regular prenatal check-up no fetal heart activity was detected and the intrauterine fetal death was confirmed on ultrasound.  Patient underwent induction of labor and delivered a stillborn female infant.  She is not aware of newborn weight.  No autopsy was performed.  Details of her delivery records are not available in EPIC.  2. 2010: [redacted] weeks gestation patient had severe lower abdominal pain and on arrival at the hospital fetal death was confirmed.  Her pregnancy was complicated by preeclampsia and she was told she had placental abruption.  She had vaginal delivery of a fetal bone female infant.  Patient did not receive any blood transfusion and had good post partum recovery.  3. 2011: In her third pregnancy she had dichorionic-diamniotic twins.  At [redacted] weeks gestation, she went into preterm labor and the cervix was 2 cm dilated.  After brief hospitalization, the patient was discharged.  She returned at [redacted] weeks gestation and went into spontaneous labor.  Patient had vaginal delivery of both twins.  She reports her boy weighed 1 pound 12 ounces and girl 2 pounds 4 ounces.  Both infant stayed in the NICU for 2 weeks and were discharged in stable conditions.  They are both alive and well.  4. 2017: Patient had early termination of twin pregnancy at [redacted] weeks gestation.  GYN history: No history of abnormal Pap smears or cervical surgeries. Past medical history: No history of diabetes or hypertension or any chronic medical conditions.  Patient reports she  does not have sickle cell trait.  She has anemia. Past surgical history: Nil of note. Medications: Weekly 17-alpha hydroxyprogesterone injections, prenatal vitamins, aspirin 162 mg daily. Allergies: Penicillin (not sure of reactions). Social history: Denies tobacco drug or alcohol use.  Her partner is an African-American (not the father of twins) and he is in good health. Family history: Mother had cervical cancer and she is in good health now.  Both her grandmother and uncle had DVT.  Prenatal course: On cell free fetal DNA screening, the risks of fetal aneuploidies are not increased.  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  Hemoglobin 9.1g/dL.  We performed a fetal anatomy scan.  No markers of aneuploidies or obvious fetal structural defects are seen.  Amniotic fluid is normal and good fetal activity seen.  Fetal biometry is consistent with her previously established dates.  Because of a history of preterm delivery, we performed a transvaginal ultrasound to evaluate the cervical length.  The cervix measures 3 cm, which is within normal limits.  No shortening or funneling is seen on transfundal pressure.  I counseled the patient on the following: History of stillbirths It seems she had unexplained stillbirth in her first pregnancy.  In the absence of records it is also difficult to determine the cause of fetal death.  Her prenatal course had been uneventful till diagnosis.  Patient does not have any personal history of venous thromboembolism.  In the second pregnancy, she had preeclampsia and placental abruption.  I discussed the association of preeclampsia with placental abruption.  I counseled her on low-dose aspirin that helps delaying or preventing development of preeclampsia.  Patient is taking aspirin 162 mg (twice the normal dose) daily.  I do not recommend thrombophilia work-up now.  History of preterm delivery Twin pregnancy is a likely cause of preterm  delivery.  I reassured her of normal cervical length measurements on today's ultrasound and that cervical incompetence is unlikely.  Patient is taking progesterone prophylaxis because of her history of preterm delivery.  I reassured her that singleton pregnancy is less likely to be associated with preterm delivery.  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  I reassured the patient that the presence of these antibodies are unlikely to cause fetal hemolysis.  I recommend screening before her next ultrasound and if the bodies are persistently positive, we will perform middle cerebral artery Doppler study to rule out fetal anemia.  Recommendations: -Repeat screening for antibodies (blood bank) before her next ultrasound visit. -An appointment was made for her to return in 4 weeks for completion of fetal anatomy.  -Fetal growth assessments every 4 weeks. -Weekly BPP from 32 weeks. -Delivery at 39 weeks provided antenatal testing are reassuring.  Thank you for consultation. If you have any questions or concerns, please do not hesitate to call us at the [facility]. Consultation including face-to-face counseling: 45 minutes.

## 2019-02-23 ENCOUNTER — Other Ambulatory Visit: Payer: Self-pay

## 2019-02-23 ENCOUNTER — Ambulatory Visit (INDEPENDENT_AMBULATORY_CARE_PROVIDER_SITE_OTHER): Payer: Medicaid Other

## 2019-02-23 DIAGNOSIS — O09212 Supervision of pregnancy with history of pre-term labor, second trimester: Secondary | ICD-10-CM | POA: Diagnosis not present

## 2019-02-23 DIAGNOSIS — Z3A22 22 weeks gestation of pregnancy: Secondary | ICD-10-CM

## 2019-02-23 DIAGNOSIS — O09899 Supervision of other high risk pregnancies, unspecified trimester: Secondary | ICD-10-CM

## 2019-02-23 NOTE — Progress Notes (Signed)
I have reviewed this chart and agree with the RN/CMA assessment and management.    K. Meryl Othel Hoogendoorn, M.D. Attending [facility] (Faculty Practice)   

## 2019-02-23 NOTE — Progress Notes (Signed)
Pt is in the office for 17-p injection, administered in R arm and pt tolerated well. .. Administrations This Visit    HYDROXYprogesterone Caproate SOAJ 275 mg    Admin Date 02/23/2019 Action Given Dose 275 mg Route Subcutaneous Administered By Katrina Stack, RN

## 2019-03-02 ENCOUNTER — Ambulatory Visit (INDEPENDENT_AMBULATORY_CARE_PROVIDER_SITE_OTHER): Payer: Medicaid Other

## 2019-03-02 ENCOUNTER — Other Ambulatory Visit: Payer: Self-pay

## 2019-03-02 VITALS — BP 102/69 | HR 93 | Ht 65.0 in | Wt 132.0 lb

## 2019-03-02 DIAGNOSIS — O09212 Supervision of pregnancy with history of pre-term labor, second trimester: Secondary | ICD-10-CM | POA: Diagnosis not present

## 2019-03-02 DIAGNOSIS — Z3A24 24 weeks gestation of pregnancy: Secondary | ICD-10-CM

## 2019-03-02 DIAGNOSIS — O09899 Supervision of other high risk pregnancies, unspecified trimester: Secondary | ICD-10-CM

## 2019-03-02 NOTE — Progress Notes (Signed)
OB presents for 17P injection, given in LA, tolerated well.  Next injection 1 week  Administrations This Visit    HYDROXYprogesterone Caproate SOAJ 275 mg    Admin Date 03/02/2019 Action Given Dose 275 mg Route Subcutaneous Administered By Maretta Bees, RMA

## 2019-03-09 ENCOUNTER — Ambulatory Visit (INDEPENDENT_AMBULATORY_CARE_PROVIDER_SITE_OTHER): Payer: Medicaid Other | Admitting: Obstetrics & Gynecology

## 2019-03-09 ENCOUNTER — Other Ambulatory Visit: Payer: Self-pay

## 2019-03-09 ENCOUNTER — Encounter: Payer: Self-pay | Admitting: Obstetrics & Gynecology

## 2019-03-09 VITALS — BP 99/64 | HR 103 | Wt 130.0 lb

## 2019-03-09 DIAGNOSIS — O09212 Supervision of pregnancy with history of pre-term labor, second trimester: Secondary | ICD-10-CM | POA: Diagnosis not present

## 2019-03-09 DIAGNOSIS — O09892 Supervision of other high risk pregnancies, second trimester: Secondary | ICD-10-CM

## 2019-03-09 DIAGNOSIS — O0992 Supervision of high risk pregnancy, unspecified, second trimester: Secondary | ICD-10-CM

## 2019-03-09 DIAGNOSIS — Z8759 Personal history of other complications of pregnancy, childbirth and the puerperium: Secondary | ICD-10-CM

## 2019-03-09 DIAGNOSIS — O09292 Supervision of pregnancy with other poor reproductive or obstetric history, second trimester: Secondary | ICD-10-CM

## 2019-03-09 DIAGNOSIS — Z3A24 24 weeks gestation of pregnancy: Secondary | ICD-10-CM | POA: Diagnosis not present

## 2019-03-09 DIAGNOSIS — O09899 Supervision of other high risk pregnancies, unspecified trimester: Secondary | ICD-10-CM

## 2019-03-09 DIAGNOSIS — O0990 Supervision of high risk pregnancy, unspecified, unspecified trimester: Secondary | ICD-10-CM

## 2019-03-09 MED ORDER — HYDROXYPROGESTERONE CAPROATE 275 MG/1.1ML ~~LOC~~ SOAJ
275.0000 mg | Freq: Once | SUBCUTANEOUS | Status: AC
  Administered 2019-03-09: 14:00:00 275 mg via SUBCUTANEOUS

## 2019-03-09 NOTE — Progress Notes (Signed)
   PRENATAL VISIT NOTE  Subjective:  Kendra Fields is a 29 y.o. [id number] at [redacted]w[redacted]d being seen today for ongoing prenatal care.  She is currently monitored for the following issues for this high-risk pregnancy and has History of pre-eclampsia; History of preterm delivery, currently pregnant; Supervision of high risk pregnancy, antepartum; History of IUFD; [redacted] weeks gestation of pregnancy; and History of stillbirth in currently pregnant patient, second trimester on their problem list.  Patient reports no complaints.  Contractions: Not present. Vag. Bleeding: None.  Movement: Present. Denies leaking of fluid.   The following portions of the patient's history were reviewed and updated as appropriate: allergies, current medications, past family history, past medical history, past social history, past surgical history and problem list.   Objective:   Vitals:   03/09/19 1349  BP: 99/64  Pulse: (!) 103  Weight: 130 lb (59 kg)    Fetal Status: Fetal Heart Rate (bpm): 150   Movement: Present     General:  Alert, oriented and cooperative. Patient is in no acute distress.  Skin: Skin is warm and dry. No rash noted.   Cardiovascular: Normal heart rate noted  Respiratory: Normal respiratory effort, no problems with respiration noted  Abdomen: Soft, gravid, appropriate for gestational age.  Pain/Pressure: Present     Pelvic: Cervical exam deferred        Extremities: Normal range of motion.  Edema: None  Mental Status: Normal mood and affect. Normal behavior. Normal judgment and thought content.   Assessment and Plan:  Pregnancy: [id number] at [redacted]w[redacted]d 1. History of preterm delivery, currently pregnant Every week - HYDROXYprogesterone caproate (Makena) autoinjector 275 mg  2. History of pre-eclampsia Nl BP, take ASA  3. History of stillbirth in currently pregnant patient, second trimester Fetal testing after 32 weeks  4. Supervision of high risk pregnancy, antepartum   Preterm labor symptoms  and general obstetric precautions including but not limited to vaginal bleeding, contractions, leaking of fluid and fetal movement were reviewed in detail with the patient. Please refer to After Visit Summary for other counseling recommendations.   Return in about 3 weeks (around 03/30/2019), or 17 p every week, for 2 hr.  Future Appointments  Date Time Provider Department Center  03/16/2019  1:45 PM [facility] NURSE [facility] None  03/17/2019  1:30 PM [facility] NURSE [facility]  03/17/2019  1:45 PM [facility] [place] 2 [facility]    Scheryl Darter, MD

## 2019-03-09 NOTE — Progress Notes (Signed)
ROB  17P   CC: NONE

## 2019-03-09 NOTE — Patient Instructions (Signed)

## 2019-03-16 ENCOUNTER — Ambulatory Visit (INDEPENDENT_AMBULATORY_CARE_PROVIDER_SITE_OTHER): Payer: Medicaid Other

## 2019-03-16 ENCOUNTER — Other Ambulatory Visit: Payer: Self-pay

## 2019-03-16 DIAGNOSIS — Z3A25 25 weeks gestation of pregnancy: Secondary | ICD-10-CM

## 2019-03-16 DIAGNOSIS — O09212 Supervision of pregnancy with history of pre-term labor, second trimester: Secondary | ICD-10-CM | POA: Diagnosis not present

## 2019-03-16 DIAGNOSIS — O09899 Supervision of other high risk pregnancies, unspecified trimester: Secondary | ICD-10-CM

## 2019-03-16 NOTE — Progress Notes (Signed)
Pt is in the office for 17-p injection, administered in L arm and pt tolerated well. .. Administrations This Visit    HYDROXYprogesterone Caproate SOAJ 275 mg    Admin Date 03/16/2019 Action Given Dose 275 mg Route Subcutaneous Administered By Katrina Stack, RN

## 2019-03-16 NOTE — Progress Notes (Signed)
Patient ID: Kendra Fields, female   DOB: August 06, 1990, 29 y.o.   MRN: [id number] Patient seen and assessed by nursing staff during this encounter. I have reviewed the chart and agree with the documentation and plan.  Scheryl Darter, MD 03/16/2019 2:38 PM

## 2019-03-17 ENCOUNTER — Ambulatory Visit (HOSPITAL_COMMUNITY): Payer: Medicaid Other

## 2019-03-17 ENCOUNTER — Ambulatory Visit (HOSPITAL_COMMUNITY): Admission: RE | Admit: 2019-03-17 | Payer: Medicaid Other | Source: Ambulatory Visit

## 2019-03-23 ENCOUNTER — Encounter (HOSPITAL_COMMUNITY): Payer: Self-pay

## 2019-03-23 ENCOUNTER — Other Ambulatory Visit: Payer: Self-pay

## 2019-03-23 ENCOUNTER — Ambulatory Visit (INDEPENDENT_AMBULATORY_CARE_PROVIDER_SITE_OTHER): Payer: Medicaid Other

## 2019-03-23 ENCOUNTER — Ambulatory Visit (HOSPITAL_COMMUNITY)
Admission: RE | Admit: 2019-03-23 | Discharge: 2019-03-23 | Disposition: A | Discharge status: Home / Self Care | Payer: Medicaid Other | Source: Ambulatory Visit | Attending: Obstetrics and Gynecology | Admitting: Obstetrics and Gynecology

## 2019-03-23 ENCOUNTER — Ambulatory Visit (HOSPITAL_COMMUNITY): Payer: Medicaid Other | Admitting: *Deleted

## 2019-03-23 VITALS — BP 106/74 | HR 109 | Ht 65.0 in | Wt 134.8 lb

## 2019-03-23 DIAGNOSIS — Z8759 Personal history of other complications of pregnancy, childbirth and the puerperium: Secondary | ICD-10-CM | POA: Insufficient documentation

## 2019-03-23 DIAGNOSIS — Z3A28 28 weeks gestation of pregnancy: Secondary | ICD-10-CM

## 2019-03-23 DIAGNOSIS — O0990 Supervision of high risk pregnancy, unspecified, unspecified trimester: Secondary | ICD-10-CM | POA: Insufficient documentation

## 2019-03-23 DIAGNOSIS — O09212 Supervision of pregnancy with history of pre-term labor, second trimester: Secondary | ICD-10-CM

## 2019-03-23 DIAGNOSIS — Z3A26 26 weeks gestation of pregnancy: Secondary | ICD-10-CM

## 2019-03-23 DIAGNOSIS — Z362 Encounter for other antenatal screening follow-up: Secondary | ICD-10-CM | POA: Diagnosis not present

## 2019-03-23 DIAGNOSIS — O09213 Supervision of pregnancy with history of pre-term labor, third trimester: Secondary | ICD-10-CM

## 2019-03-23 DIAGNOSIS — O09899 Supervision of other high risk pregnancies, unspecified trimester: Secondary | ICD-10-CM

## 2019-03-23 DIAGNOSIS — O09292 Supervision of pregnancy with other poor reproductive or obstetric history, second trimester: Secondary | ICD-10-CM | POA: Diagnosis not present

## 2019-03-23 NOTE — Progress Notes (Signed)
OB presents for 17P injection, given in RA, tolerated well.  Next injection 03/30/2019  Administrations This Visit    HYDROXYprogesterone Caproate SOAJ 275 mg    Admin Date 03/23/2019 Action Given Dose 275 mg Route Subcutaneous Administered By Maretta Bees, RMA

## 2019-03-24 ENCOUNTER — Other Ambulatory Visit (HOSPITAL_COMMUNITY): Payer: Self-pay | Admitting: *Deleted

## 2019-03-24 DIAGNOSIS — O09293 Supervision of pregnancy with other poor reproductive or obstetric history, third trimester: Secondary | ICD-10-CM

## 2019-03-24 NOTE — Progress Notes (Signed)
Patient seen and assessed by nursing staff during this encounter. I have reviewed the chart and agree with the documentation and plan.  Karim Aiello, MD 03/24/2019 10:23 AM    

## 2019-03-30 ENCOUNTER — Encounter: Payer: Self-pay | Admitting: Obstetrics and Gynecology

## 2019-03-30 ENCOUNTER — Other Ambulatory Visit: Payer: Medicaid Other

## 2019-03-30 ENCOUNTER — Other Ambulatory Visit: Payer: Self-pay

## 2019-03-30 ENCOUNTER — Ambulatory Visit (INDEPENDENT_AMBULATORY_CARE_PROVIDER_SITE_OTHER): Payer: Medicaid Other | Admitting: Obstetrics and Gynecology

## 2019-03-30 VITALS — BP 101/71 | HR 92 | Wt 139.0 lb

## 2019-03-30 DIAGNOSIS — O09899 Supervision of other high risk pregnancies, unspecified trimester: Secondary | ICD-10-CM | POA: Diagnosis not present

## 2019-03-30 DIAGNOSIS — Z8759 Personal history of other complications of pregnancy, childbirth and the puerperium: Secondary | ICD-10-CM

## 2019-03-30 DIAGNOSIS — Z3480 Encounter for supervision of other normal pregnancy, unspecified trimester: Secondary | ICD-10-CM

## 2019-03-30 DIAGNOSIS — O0990 Supervision of high risk pregnancy, unspecified, unspecified trimester: Secondary | ICD-10-CM

## 2019-03-30 NOTE — Progress Notes (Signed)
   PRENATAL VISIT NOTE  Subjective:  Kendra Fields is a 29 y.o. [id number] at [redacted]w[redacted]d being seen today for ongoing prenatal care.  She is currently monitored for the following issues for this high-risk pregnancy and has History of pre-eclampsia; History of preterm delivery, currently pregnant; Supervision of high risk pregnancy, antepartum; and History of IUFD on their problem list.  Patient reports no complaints.  Contractions: Not present. Vag. Bleeding: None.  Movement: Present. Denies leaking of fluid.   The following portions of the patient's history were reviewed and updated as appropriate: allergies, current medications, past family history, past medical history, past social history, past surgical history and problem list.   Objective:   Vitals:   03/30/19 0918  BP: 101/71  Pulse: 92  Weight: 139 lb (63 kg)    Fetal Status: Fetal Heart Rate (bpm): 150 Fundal Height: 28 cm Movement: Present     General:  Alert, oriented and cooperative. Patient is in no acute distress.  Skin: Skin is warm and dry. No rash noted.   Cardiovascular: Normal heart rate noted  Respiratory: Normal respiratory effort, no problems with respiration noted  Abdomen: Soft, gravid, appropriate for gestational age.  Pain/Pressure: Absent     Pelvic: Cervical exam deferred        Extremities: Normal range of motion.  Edema: None  Mental Status: Normal mood and affect. Normal behavior. Normal judgment and thought content.   Assessment and Plan:  Pregnancy: [id number] at [redacted]w[redacted]d 1. Supervision of high risk pregnancy, antepartum Patient is doing well Third trimester labs today Patient plans patch for contraception  2. History of IUFD   3. History of pre-eclampsia Continue ASA Si/sx reviewed  4. History of preterm delivery, currently pregnant Continue weekly 17-P  Preterm labor symptoms and general obstetric precautions including but not limited to vaginal bleeding, contractions, leaking of fluid and fetal  movement were reviewed in detail with the patient. Please refer to After Visit Summary for other counseling recommendations.   Return in about 2 weeks (around 04/13/2019) for in person, ROB, High risk.  Future Appointments  Date Time Provider Department Center  04/20/2019  2:30 PM [facility] NURSE [facility]  04/20/2019  2:30 PM [facility] [place] 1 [facility]    Catalina Antigua, MD

## 2019-03-31 LAB — HIV ANTIBODY (ROUTINE TESTING W REFLEX): HIV Screen 4th Generation wRfx: NONREACTIVE

## 2019-03-31 LAB — CBC
Hematocrit: 28.7 % — ABNORMAL LOW (ref 34.0–46.6)
Hemoglobin: 9.3 g/dL — ABNORMAL LOW (ref 11.1–15.9)
MCH: 26.9 pg (ref 26.6–33.0)
MCHC: 32.4 g/dL (ref 31.5–35.7)
MCV: 83 fL (ref 79–97)
Platelets: 313 10*3/uL (ref 150–450)
RBC: 3.46 x10E6/uL — ABNORMAL LOW (ref 3.77–5.28)
RDW: 13.9 % (ref 11.7–15.4)
WBC: 6.5 10*3/uL (ref 3.4–10.8)

## 2019-03-31 LAB — RPR: RPR Ser Ql: NONREACTIVE

## 2019-03-31 LAB — GLUCOSE TOLERANCE, 2 HOURS W/ 1HR
Glucose, 1 hour: 91 mg/dL (ref 65–179)
Glucose, 2 hour: 93 mg/dL (ref 65–152)
Glucose, Fasting: 81 mg/dL (ref 65–91)

## 2019-04-06 ENCOUNTER — Other Ambulatory Visit: Payer: Self-pay

## 2019-04-06 ENCOUNTER — Ambulatory Visit (INDEPENDENT_AMBULATORY_CARE_PROVIDER_SITE_OTHER): Payer: Medicaid Other

## 2019-04-06 DIAGNOSIS — Z3A28 28 weeks gestation of pregnancy: Secondary | ICD-10-CM

## 2019-04-06 DIAGNOSIS — O09899 Supervision of other high risk pregnancies, unspecified trimester: Secondary | ICD-10-CM

## 2019-04-06 DIAGNOSIS — O09213 Supervision of pregnancy with history of pre-term labor, third trimester: Secondary | ICD-10-CM

## 2019-04-06 MED ORDER — HYDROXYPROGESTERONE CAPROATE 275 MG/1.1ML ~~LOC~~ SOAJ
275.0000 mg | Freq: Once | SUBCUTANEOUS | Status: AC
  Administered 2019-04-06: 275 mg via SUBCUTANEOUS

## 2019-04-06 NOTE — Progress Notes (Signed)
Kendra Fields is here for a weekly makena injection.  Pt tolerated injection well in the right arm without complications. Pt advised to make next appointment in one week. -EH/RMA

## 2019-04-13 ENCOUNTER — Encounter: Payer: Medicaid Other | Admitting: Obstetrics and Gynecology

## 2019-04-14 ENCOUNTER — Ambulatory Visit (INDEPENDENT_AMBULATORY_CARE_PROVIDER_SITE_OTHER): Payer: Medicaid Other | Admitting: Family Medicine

## 2019-04-14 ENCOUNTER — Other Ambulatory Visit: Payer: Self-pay

## 2019-04-14 ENCOUNTER — Encounter: Payer: Self-pay | Admitting: Family Medicine

## 2019-04-14 VITALS — BP 112/66 | HR 97 | Wt 143.4 lb

## 2019-04-14 DIAGNOSIS — Z3A30 30 weeks gestation of pregnancy: Secondary | ICD-10-CM | POA: Diagnosis not present

## 2019-04-14 DIAGNOSIS — O09213 Supervision of pregnancy with history of pre-term labor, third trimester: Secondary | ICD-10-CM

## 2019-04-14 DIAGNOSIS — O09899 Supervision of other high risk pregnancies, unspecified trimester: Secondary | ICD-10-CM

## 2019-04-14 DIAGNOSIS — O0990 Supervision of high risk pregnancy, unspecified, unspecified trimester: Secondary | ICD-10-CM

## 2019-04-14 NOTE — Patient Instructions (Signed)

## 2019-04-14 NOTE — Progress Notes (Signed)
Pt presents for ROB  Pt c/o insomnia - no relief with Tylenol PM  Makena given L arm w/o complaints

## 2019-04-15 NOTE — Progress Notes (Signed)
   PRENATAL VISIT NOTE  Subjective:  Kendra Fields is a 29 y.o. N3I1443 at [redacted]w[redacted]d being seen today for ongoing prenatal care.  She is currently monitored for the following issues for this high-risk pregnancy and has History of pre-eclampsia; History of preterm delivery, currently pregnant; Supervision of high risk pregnancy, antepartum; and History of IUFD on their problem list.  Patient reports no complaints.  Contractions: Irregular. Vag. Bleeding: None.  Movement: Present. Denies leaking of fluid.   The following portions of the patient's history were reviewed and updated as appropriate: allergies, current medications, past family history, past medical history, past social history, past surgical history and problem list.   Objective:   Vitals:   04/14/19 1131  BP: 112/66  Pulse: 97  Weight: 143 lb 6.4 oz (65 kg)    Fetal Status: Fetal Heart Rate (bpm): 148    Movement: Present     General:  Alert, oriented and cooperative. Patient is in no acute distress.  Skin: Skin is warm and dry. No rash noted.   Cardiovascular: Normal heart rate noted  Respiratory: Normal respiratory effort, no problems with respiration noted  Abdomen: Soft, gravid, appropriate for gestational age.  Pain/Pressure: Absent     Pelvic: Cervical exam deferred        Extremities: Normal range of motion.  Edema: None  Mental Status: Normal mood and affect. Normal behavior. Normal judgment and thought content.   Assessment and Plan:  Pregnancy: X5Q0086 at [redacted]w[redacted]d 1. Supervision of high risk pregnancy, antepartum Continue prenatal care. Nml 28 wk labs   2. History of preterm delivery, currently pregnant Continue 17 P  Preterm labor symptoms and general obstetric precautions including but not limited to vaginal bleeding, contractions, leaking of fluid and fetal movement were reviewed in detail with the patient. Please refer to After Visit Summary for other counseling recommendations.   Return in 2 weeks (on  04/28/2019) for 17 P weekly, in person.  Future Appointments  Date Time Provider Department Center  04/20/2019  2:30 PM [facility] NURSE [facility]  04/20/2019  2:30 PM [facility] [place] 1 [facility] MFC-US  04/21/2019  1:45 PM [facility] NURSE [facility] None  04/28/2019  1:45 PM Constant, Gigi Gin, MD [facility] None    Reva Bores, MD

## 2019-04-20 ENCOUNTER — Encounter (HOSPITAL_COMMUNITY): Payer: Self-pay

## 2019-04-20 ENCOUNTER — Ambulatory Visit (HOSPITAL_COMMUNITY)
Admission: RE | Admit: 2019-04-20 | Discharge: 2019-04-20 | Disposition: A | Discharge status: Home / Self Care | Payer: Medicaid Other | Source: Ambulatory Visit | Attending: Obstetrics and Gynecology | Admitting: Obstetrics and Gynecology

## 2019-04-20 ENCOUNTER — Other Ambulatory Visit: Payer: Self-pay

## 2019-04-20 ENCOUNTER — Ambulatory Visit (HOSPITAL_COMMUNITY): Payer: Medicaid Other | Admitting: *Deleted

## 2019-04-20 DIAGNOSIS — Z3A30 30 weeks gestation of pregnancy: Secondary | ICD-10-CM | POA: Diagnosis not present

## 2019-04-20 DIAGNOSIS — O09213 Supervision of pregnancy with history of pre-term labor, third trimester: Secondary | ICD-10-CM

## 2019-04-20 DIAGNOSIS — Z148 Genetic carrier of other disease: Secondary | ICD-10-CM | POA: Diagnosis not present

## 2019-04-20 DIAGNOSIS — O0990 Supervision of high risk pregnancy, unspecified, unspecified trimester: Secondary | ICD-10-CM

## 2019-04-20 DIAGNOSIS — O09293 Supervision of pregnancy with other poor reproductive or obstetric history, third trimester: Secondary | ICD-10-CM | POA: Insufficient documentation

## 2019-04-21 ENCOUNTER — Other Ambulatory Visit (HOSPITAL_COMMUNITY): Payer: Self-pay | Admitting: *Deleted

## 2019-04-21 ENCOUNTER — Ambulatory Visit (INDEPENDENT_AMBULATORY_CARE_PROVIDER_SITE_OTHER): Payer: Medicaid Other | Admitting: *Deleted

## 2019-04-21 DIAGNOSIS — O09219 Supervision of pregnancy with history of pre-term labor, unspecified trimester: Secondary | ICD-10-CM | POA: Diagnosis not present

## 2019-04-21 DIAGNOSIS — O0990 Supervision of high risk pregnancy, unspecified, unspecified trimester: Secondary | ICD-10-CM

## 2019-04-21 DIAGNOSIS — O09293 Supervision of pregnancy with other poor reproductive or obstetric history, third trimester: Secondary | ICD-10-CM

## 2019-04-21 NOTE — Progress Notes (Signed)
Pt is in office for 17p injection. Pt tolerated injection well in right arm. Pt has no other concerns today.  Administrations This Visit    HYDROXYprogesterone Caproate SOAJ 275 mg    Admin Date 04/21/2019 Action Given Dose 275 mg Route Subcutaneous Administered By Lanney Gins, CMA

## 2019-04-28 ENCOUNTER — Encounter: Payer: Self-pay | Admitting: Obstetrics and Gynecology

## 2019-04-28 ENCOUNTER — Other Ambulatory Visit: Payer: Self-pay

## 2019-04-28 ENCOUNTER — Ambulatory Visit (INDEPENDENT_AMBULATORY_CARE_PROVIDER_SITE_OTHER): Payer: Medicaid Other | Admitting: Obstetrics and Gynecology

## 2019-04-28 VITALS — BP 95/64 | HR 111 | Wt 146.0 lb

## 2019-04-28 DIAGNOSIS — Z3A31 31 weeks gestation of pregnancy: Secondary | ICD-10-CM | POA: Diagnosis not present

## 2019-04-28 DIAGNOSIS — O0990 Supervision of high risk pregnancy, unspecified, unspecified trimester: Secondary | ICD-10-CM

## 2019-04-28 DIAGNOSIS — O09899 Supervision of other high risk pregnancies, unspecified trimester: Secondary | ICD-10-CM

## 2019-04-28 DIAGNOSIS — Z8759 Personal history of other complications of pregnancy, childbirth and the puerperium: Secondary | ICD-10-CM

## 2019-04-28 DIAGNOSIS — O09213 Supervision of pregnancy with history of pre-term labor, third trimester: Secondary | ICD-10-CM

## 2019-04-28 MED ORDER — COMFORT FIT MATERNITY SUPP LG MISC: 0 refills | Status: DC

## 2019-04-28 NOTE — Progress Notes (Signed)
   PRENATAL VISIT NOTE  Subjective:  Kendra Fields is a 29 y.o. N2T5573 at [redacted]w[redacted]d being seen today for ongoing prenatal care.  She is currently monitored for the following issues for this high-risk pregnancy and has History of pre-eclampsia; History of preterm delivery, currently pregnant; Supervision of high risk pregnancy, antepartum; and History of IUFD on their problem list.  Patient reports backache.  Contractions: Irregular. Vag. Bleeding: None.  Movement: Present. Denies leaking of fluid.   The following portions of the patient's history were reviewed and updated as appropriate: allergies, current medications, past family history, past medical history, past social history, past surgical history and problem list.   Objective:   Vitals:   04/28/19 1352  BP: 95/64  Pulse: (!) 111  Weight: 146 lb (66.2 kg)    Fetal Status: Fetal Heart Rate (bpm): 150 Fundal Height: 31 cm Movement: Present     General:  Alert, oriented and cooperative. Patient is in no acute distress.  Skin: Skin is warm and dry. No rash noted.   Cardiovascular: Normal heart rate noted  Respiratory: Normal respiratory effort, no problems with respiration noted  Abdomen: Soft, gravid, appropriate for gestational age.  Pain/Pressure: Present     Pelvic: Cervical exam deferred        Extremities: Normal range of motion.  Edema: None  Mental Status: Normal mood and affect. Normal behavior. Normal judgment and thought content.   Assessment and Plan:  Pregnancy: [id number] at [redacted]w[redacted]d 1. Supervision of high risk pregnancy, antepartum Patient is doing well  2. History of IUFD Continue weekly antenatal testing  3. History of pre-eclampsia Normotensive and asymptomatic Continue ASA  4. History of preterm delivery, currently pregnant Continue weekly 17-P  Preterm labor symptoms and general obstetric precautions including but not limited to vaginal bleeding, contractions, leaking of fluid and fetal movement were  reviewed in detail with the patient. Please refer to After Visit Summary for other counseling recommendations.   Return in about 1 week (around 05/05/2019) for in person, ROB, High risk, NST.  Future Appointments  Date Time Provider Department Center  05/04/2019 12:45 PM [facility] NURSE [facility]  05/04/2019 12:45 PM [facility] [place] 5 [facility]-M[facility] MFC-US  05/11/2019  1:15 PM [facility] NURSE WH-MFC MFC-US  05/11/2019  1:15 PM [facility] [place] 4 WH-MFCUS MFC-US  05/18/2019 12:30 PM [facility] NURSE WH-MFC MFC-US  05/18/2019 12:30 PM [facility] [place] 1 [facility]-M[facility] MFC-US    Catalina Antigua, MD

## 2019-04-28 NOTE — Progress Notes (Addendum)
Pt presents for ROB no c/o per pt Makena given L arm w/o complaint

## 2019-05-04 ENCOUNTER — Ambulatory Visit (HOSPITAL_COMMUNITY)
Admission: RE | Admit: 2019-05-04 | Discharge: 2019-05-04 | Disposition: A | Discharge status: Home / Self Care | Payer: Medicaid Other | Source: Ambulatory Visit | Attending: Obstetrics and Gynecology | Admitting: Obstetrics and Gynecology

## 2019-05-04 ENCOUNTER — Encounter (HOSPITAL_COMMUNITY): Payer: Self-pay

## 2019-05-04 ENCOUNTER — Ambulatory Visit (HOSPITAL_COMMUNITY): Payer: Medicaid Other | Admitting: *Deleted

## 2019-05-04 ENCOUNTER — Other Ambulatory Visit: Payer: Self-pay

## 2019-05-04 DIAGNOSIS — O09213 Supervision of pregnancy with history of pre-term labor, third trimester: Secondary | ICD-10-CM

## 2019-05-04 DIAGNOSIS — Z148 Genetic carrier of other disease: Secondary | ICD-10-CM

## 2019-05-04 DIAGNOSIS — O0990 Supervision of high risk pregnancy, unspecified, unspecified trimester: Secondary | ICD-10-CM | POA: Diagnosis present

## 2019-05-04 DIAGNOSIS — O09293 Supervision of pregnancy with other poor reproductive or obstetric history, third trimester: Secondary | ICD-10-CM | POA: Insufficient documentation

## 2019-05-04 DIAGNOSIS — Z3A32 32 weeks gestation of pregnancy: Secondary | ICD-10-CM

## 2019-05-05 ENCOUNTER — Ambulatory Visit (INDEPENDENT_AMBULATORY_CARE_PROVIDER_SITE_OTHER): Payer: Medicaid Other | Admitting: Obstetrics & Gynecology

## 2019-05-05 VITALS — BP 113/77 | HR 96 | Wt 150.0 lb

## 2019-05-05 DIAGNOSIS — O4700 False labor before 37 completed weeks of gestation, unspecified trimester: Secondary | ICD-10-CM

## 2019-05-05 DIAGNOSIS — Z3A32 32 weeks gestation of pregnancy: Secondary | ICD-10-CM | POA: Diagnosis not present

## 2019-05-05 DIAGNOSIS — O09213 Supervision of pregnancy with history of pre-term labor, third trimester: Secondary | ICD-10-CM

## 2019-05-05 NOTE — Progress Notes (Signed)
  Subjective:    Kendra Fields is a 29 y.o. [id number] [redacted]w[redacted]d being seen today for her obstetrical visit.  Patient reports backache and occasional contractions. Fetal movement: normal.  Objective:    BP 113/77   Pulse 96   Wt 68 kg   LMP 09/17/2018   BMI 24.96 kg/m   Physical Exam  Vitals reviewed. Constitutional: She is oriented to person, place, and time. She appears well-developed and well-nourished.  HENT:  Head: Normocephalic and atraumatic.  Eyes: Pupils are equal, round, and reactive to light.  Cardiovascular: Normal rate.  Respiratory: Effort normal.  GI: Soft. She exhibits no distension. There is no abdominal tenderness. There is no guarding.  Musculoskeletal:        General: No edema.     Cervical back: Normal range of motion.  Neurological: She is alert and oriented to person, place, and time.  Skin: Skin is warm and dry.  Psychiatric: She has a normal mood and affect. Her behavior is normal.    Maternal Exam:  Uterine Assessment: Contraction frequency is irregular.      Fetal Exam Fetal Monitor Review: Baseline rate: 130.  Variability: moderate (6-25 bpm).   Pattern: accelerations present and no decelerations.    Fetal State Assessment: Category I - tracings are normal.      FHT: Fetal Heart Rate (bpm): NST Reactive  Uterine Size:  32  Presentation:  cephalic     Assessment:    Pregnancy:  [id number]    Plan:    Patient Active Problem List   Diagnosis Date Noted   History of IUFD 02/09/2019   History of pre-eclampsia 02/01/2019   History of preterm delivery, currently pregnant 02/01/2019   Supervision of high risk pregnancy, antepartum 02/01/2019    preterm labor   precautions given.  Follow up in 1 Week.  Pt doing well overall.

## 2019-05-06 NOTE — Patient Instructions (Signed)

## 2019-05-11 ENCOUNTER — Other Ambulatory Visit: Payer: Self-pay

## 2019-05-11 ENCOUNTER — Ambulatory Visit (INDEPENDENT_AMBULATORY_CARE_PROVIDER_SITE_OTHER): Payer: Medicaid Other | Admitting: Obstetrics & Gynecology

## 2019-05-11 ENCOUNTER — Encounter (HOSPITAL_COMMUNITY): Payer: Self-pay

## 2019-05-11 ENCOUNTER — Ambulatory Visit (HOSPITAL_COMMUNITY)
Admission: RE | Admit: 2019-05-11 | Discharge: 2019-05-11 | Disposition: A | Discharge status: Home / Self Care | Payer: Medicaid Other | Source: Ambulatory Visit | Attending: Obstetrics and Gynecology | Admitting: Obstetrics and Gynecology

## 2019-05-11 ENCOUNTER — Ambulatory Visit (HOSPITAL_COMMUNITY): Payer: Medicaid Other | Admitting: *Deleted

## 2019-05-11 VITALS — BP 97/67 | HR 103 | Wt 148.7 lb

## 2019-05-11 DIAGNOSIS — O09213 Supervision of pregnancy with history of pre-term labor, third trimester: Secondary | ICD-10-CM

## 2019-05-11 DIAGNOSIS — O0990 Supervision of high risk pregnancy, unspecified, unspecified trimester: Secondary | ICD-10-CM | POA: Insufficient documentation

## 2019-05-11 DIAGNOSIS — O4700 False labor before 37 completed weeks of gestation, unspecified trimester: Secondary | ICD-10-CM | POA: Diagnosis not present

## 2019-05-11 DIAGNOSIS — Z3A33 33 weeks gestation of pregnancy: Secondary | ICD-10-CM

## 2019-05-11 DIAGNOSIS — O09293 Supervision of pregnancy with other poor reproductive or obstetric history, third trimester: Secondary | ICD-10-CM | POA: Diagnosis present

## 2019-05-11 DIAGNOSIS — Z8759 Personal history of other complications of pregnancy, childbirth and the puerperium: Secondary | ICD-10-CM

## 2019-05-11 DIAGNOSIS — Z148 Genetic carrier of other disease: Secondary | ICD-10-CM | POA: Diagnosis not present

## 2019-05-11 MED ORDER — HYDROXYPROGESTERONE CAPROATE 275 MG/1.1ML ~~LOC~~ SOAJ
275.0000 mg | Freq: Once | SUBCUTANEOUS | Status: AC
  Administered 2019-05-11: 16:00:00 275 mg via SUBCUTANEOUS

## 2019-05-11 NOTE — Progress Notes (Signed)
   PRENATAL VISIT NOTE  Subjective:  Kendra Fields is a 29 y.o. [id number] at [redacted]w[redacted]d being seen today for ongoing prenatal care.  She is currently monitored for the following issues for this high-risk pregnancy and has History of pre-eclampsia; History of preterm delivery, currently pregnant; Supervision of high risk pregnancy, antepartum; and History of IUFD on their problem list.  Patient reports no complaints.  Contractions: Irritability. Vag. Bleeding: None.  Movement: Present. Denies leaking of fluid.   The following portions of the patient's history were reviewed and updated as appropriate: allergies, current medications, past family history, past medical history, past social history, past surgical history and problem list.   Objective:   Vitals:   05/11/19 1534  BP: 97/67  Pulse: (!) 103  Weight: 148 lb 11.2 oz (67.4 kg)    Fetal Status: Fetal Heart Rate (bpm): NST Fundal Height: 34 cm Movement: Present     General:  Alert, oriented and cooperative. Patient is in no acute distress.  Skin: Skin is warm and dry. No rash noted.   Cardiovascular: Normal heart rate noted  Respiratory: Normal respiratory effort, no problems with respiration noted  Abdomen: Soft, gravid, appropriate for gestational age.  Pain/Pressure: Present     Pelvic: Cervical exam deferred        Extremities: Normal range of motion.  Edema: None  Mental Status: Normal mood and affect. Normal behavior. Normal judgment and thought content.   Assessment and Plan:  Pregnancy: A5W0981 at [redacted]w[redacted]d 1. Threatened premature labor, antepartum Makena weekly  2. History of IUFD BPP 10/10 with reactive NST in office today  3. Supervision of high risk pregnancy, antepartum IOL 37 weeks recommended - Fetal nonstress test  Preterm labor symptoms and general obstetric precautions including but not limited to vaginal bleeding, contractions, leaking of fluid and fetal movement were reviewed in detail with the patient. Please  refer to After Visit Summary for other counseling recommendations.   Return in about 1 week (around 05/18/2019).  Future Appointments  Date Time Provider Department Center  05/18/2019 12:30 PM [facility] NURSE WH-M[facility] MFC-US  05/18/2019 12:30 PM [facility] [place] 1 [facility] MFC-US  05/19/2019  8:30 AM Constant, Gigi Gin, MD [facility] None    Scheryl Darter, MD

## 2019-05-11 NOTE — Progress Notes (Signed)
Pt is here for ROB, [redacted]w[redacted]d. Appt at [facility] today recommends delivery at 37weeks.

## 2019-05-11 NOTE — Patient Instructions (Signed)

## 2019-05-18 ENCOUNTER — Ambulatory Visit (HOSPITAL_COMMUNITY)
Admission: RE | Admit: 2019-05-18 | Discharge: 2019-05-18 | Disposition: A | Discharge status: Home / Self Care | Payer: Medicaid Other | Source: Ambulatory Visit | Attending: Obstetrics and Gynecology | Admitting: Obstetrics and Gynecology

## 2019-05-18 ENCOUNTER — Other Ambulatory Visit: Payer: Self-pay

## 2019-05-18 ENCOUNTER — Encounter (HOSPITAL_COMMUNITY): Payer: Self-pay

## 2019-05-18 ENCOUNTER — Ambulatory Visit (HOSPITAL_COMMUNITY): Payer: Medicaid Other | Admitting: *Deleted

## 2019-05-18 ENCOUNTER — Other Ambulatory Visit (HOSPITAL_COMMUNITY): Payer: Self-pay | Admitting: *Deleted

## 2019-05-18 DIAGNOSIS — Z148 Genetic carrier of other disease: Secondary | ICD-10-CM

## 2019-05-18 DIAGNOSIS — O09213 Supervision of pregnancy with history of pre-term labor, third trimester: Secondary | ICD-10-CM

## 2019-05-18 DIAGNOSIS — O0990 Supervision of high risk pregnancy, unspecified, unspecified trimester: Secondary | ICD-10-CM

## 2019-05-18 DIAGNOSIS — O09293 Supervision of pregnancy with other poor reproductive or obstetric history, third trimester: Secondary | ICD-10-CM | POA: Diagnosis present

## 2019-05-18 DIAGNOSIS — Z362 Encounter for other antenatal screening follow-up: Secondary | ICD-10-CM

## 2019-05-18 DIAGNOSIS — Z8759 Personal history of other complications of pregnancy, childbirth and the puerperium: Secondary | ICD-10-CM

## 2019-05-18 DIAGNOSIS — Z3A34 34 weeks gestation of pregnancy: Secondary | ICD-10-CM | POA: Diagnosis not present

## 2019-05-19 ENCOUNTER — Encounter: Payer: Self-pay | Admitting: Obstetrics and Gynecology

## 2019-05-19 ENCOUNTER — Ambulatory Visit (INDEPENDENT_AMBULATORY_CARE_PROVIDER_SITE_OTHER): Payer: Medicaid Other | Admitting: Obstetrics and Gynecology

## 2019-05-19 VITALS — BP 110/76 | HR 83 | Wt 153.0 lb

## 2019-05-19 DIAGNOSIS — O0990 Supervision of high risk pregnancy, unspecified, unspecified trimester: Secondary | ICD-10-CM

## 2019-05-19 DIAGNOSIS — O09293 Supervision of pregnancy with other poor reproductive or obstetric history, third trimester: Secondary | ICD-10-CM | POA: Diagnosis not present

## 2019-05-19 DIAGNOSIS — O09213 Supervision of pregnancy with history of pre-term labor, third trimester: Secondary | ICD-10-CM

## 2019-05-19 DIAGNOSIS — Z8759 Personal history of other complications of pregnancy, childbirth and the puerperium: Secondary | ICD-10-CM

## 2019-05-19 DIAGNOSIS — Z3A34 34 weeks gestation of pregnancy: Secondary | ICD-10-CM

## 2019-05-19 DIAGNOSIS — O09899 Supervision of other high risk pregnancies, unspecified trimester: Secondary | ICD-10-CM

## 2019-05-19 NOTE — Progress Notes (Signed)
Pt supply Makena given R arm w/o difficulty

## 2019-05-19 NOTE — Progress Notes (Signed)
   PRENATAL VISIT NOTE  Subjective:  Kendra Fields is a 29 y.o. X5Q0086 at [redacted]w[redacted]d being seen today for ongoing prenatal care.  She is currently monitored for the following issues for this high-risk pregnancy and has History of pre-eclampsia; History of preterm delivery, currently pregnant; Supervision of high risk pregnancy, antepartum; and History of IUFD on their problem list.  Patient reports no complaints.  Contractions: Irregular. Vag. Bleeding: None.  Movement: Present. Denies leaking of fluid.   The following portions of the patient's history were reviewed and updated as appropriate: allergies, current medications, past family history, past medical history, past social history, past surgical history and problem list.   Objective:   Vitals:   05/19/19 0840  BP: 110/76  Pulse: 83  Weight: 153 lb (69.4 kg)    Fetal Status: Fetal Heart Rate (bpm): NST Fundal Height: 35 cm Movement: Present     General:  Alert, oriented and cooperative. Patient is in no acute distress.  Skin: Skin is warm and dry. No rash noted.   Cardiovascular: Normal heart rate noted  Respiratory: Normal respiratory effort, no problems with respiration noted  Abdomen: Soft, gravid, appropriate for gestational age.  Pain/Pressure: Present     Pelvic: Cervical exam deferred        Extremities: Normal range of motion.     Mental Status: Normal mood and affect. Normal behavior. Normal judgment and thought content.   Assessment and Plan:  Pregnancy: P6P9509 at [redacted]w[redacted]d 1. Supervision of high risk pregnancy, antepartum Patient is doing well without complaints  2. History of preterm delivery, currently pregnant Continue weekly 17-P  3. History of pre-eclampsia Continue ASA  4. History of IUFD BPP 8/8 4/21 NST reviewed and reactive with baseline 140, mod variability, +accels, no decels Patient scheduled for IOL at 37 weeks  Preterm labor symptoms and general obstetric precautions including but not limited to  vaginal bleeding, contractions, leaking of fluid and fetal movement were reviewed in detail with the patient. Please refer to After Visit Summary for other counseling recommendations.   Return in about 1 week (around 05/26/2019) for ROB, NST.  Future Appointments  Date Time Provider Department Center  05/24/2019  2:15 PM [facility] NST [facility] MFC-US  05/24/2019  2:20 PM [facility] NURSE WH-MFC MFC-US  06/02/2019  4:00 PM [facility] NURSE WH-MFC MFC-US  06/02/2019  4:00 PM [facility] [place] 3 WH-MFCUS MFC-US  06/03/2019  7:15 AM [facility] SCHED ROOM [facility] None    Catalina Antigua, MD

## 2019-05-24 ENCOUNTER — Ambulatory Visit (HOSPITAL_COMMUNITY): Payer: Medicaid Other

## 2019-05-24 ENCOUNTER — Ambulatory Visit (HOSPITAL_COMMUNITY): Payer: Medicaid Other | Attending: Obstetrics and Gynecology

## 2019-05-24 ENCOUNTER — Encounter (HOSPITAL_COMMUNITY): Payer: Self-pay

## 2019-05-25 ENCOUNTER — Telehealth (HOSPITAL_COMMUNITY): Payer: Self-pay | Admitting: *Deleted

## 2019-05-25 NOTE — Telephone Encounter (Signed)
Preadmission screen  

## 2019-05-26 ENCOUNTER — Encounter (HOSPITAL_COMMUNITY): Payer: Self-pay | Admitting: *Deleted

## 2019-05-26 ENCOUNTER — Telehealth (HOSPITAL_COMMUNITY): Payer: Self-pay | Admitting: *Deleted

## 2019-05-26 ENCOUNTER — Encounter: Payer: Self-pay | Admitting: Obstetrics and Gynecology

## 2019-05-26 ENCOUNTER — Other Ambulatory Visit: Payer: Self-pay

## 2019-05-26 ENCOUNTER — Ambulatory Visit (INDEPENDENT_AMBULATORY_CARE_PROVIDER_SITE_OTHER): Payer: Medicaid Other | Admitting: Obstetrics and Gynecology

## 2019-05-26 ENCOUNTER — Other Ambulatory Visit (HOSPITAL_COMMUNITY)
Admission: RE | Admit: 2019-05-26 | Discharge: 2019-05-26 | Disposition: A | Discharge status: Home / Self Care | Payer: Medicaid Other | Source: Ambulatory Visit | Attending: Obstetrics and Gynecology | Admitting: Obstetrics and Gynecology

## 2019-05-26 VITALS — BP 108/69 | HR 101 | Wt 155.4 lb

## 2019-05-26 DIAGNOSIS — O0990 Supervision of high risk pregnancy, unspecified, unspecified trimester: Secondary | ICD-10-CM | POA: Diagnosis present

## 2019-05-26 DIAGNOSIS — O365930 Maternal care for other known or suspected poor fetal growth, third trimester, not applicable or unspecified: Secondary | ICD-10-CM

## 2019-05-26 DIAGNOSIS — O09293 Supervision of pregnancy with other poor reproductive or obstetric history, third trimester: Secondary | ICD-10-CM

## 2019-05-26 DIAGNOSIS — Z3A35 35 weeks gestation of pregnancy: Secondary | ICD-10-CM

## 2019-05-26 DIAGNOSIS — O09213 Supervision of pregnancy with history of pre-term labor, third trimester: Secondary | ICD-10-CM

## 2019-05-26 DIAGNOSIS — Z8759 Personal history of other complications of pregnancy, childbirth and the puerperium: Secondary | ICD-10-CM

## 2019-05-26 DIAGNOSIS — O09899 Supervision of other high risk pregnancies, unspecified trimester: Secondary | ICD-10-CM

## 2019-05-26 NOTE — Progress Notes (Signed)
Pt presents for ROB/NST/17p IOL scheduled May 7th  17p given L arm w/o complaints

## 2019-05-26 NOTE — Patient Instructions (Signed)

## 2019-05-26 NOTE — Progress Notes (Signed)
Subjective:  Kendra Fields is a 29 y.o. [id number] at [redacted]w[redacted]d being seen today for ongoing prenatal care.  She is currently monitored for the following issues for this high-risk pregnancy and has History of pre-eclampsia; History of preterm delivery, currently pregnant; Supervision of high risk pregnancy, antepartum; and History of IUFD on their problem list.  Patient reports general discomforts of pregnancy.  Contractions: Irregular. Vag. Bleeding: None.  Movement: Present. Denies leaking of fluid.   The following portions of the patient's history were reviewed and updated as appropriate: allergies, current medications, past family history, past medical history, past social history, past surgical history and problem list. Problem list updated.  Objective:   Vitals:   05/26/19 1434  BP: 108/69  Pulse: (!) 101  Weight: 155 lb 6.4 oz (70.5 kg)    Fetal Status: Fetal Heart Rate (bpm): NST   Movement: Present     General:  Alert, oriented and cooperative. Patient is in no acute distress.  Skin: Skin is warm and dry. No rash noted.   Cardiovascular: Normal heart rate noted  Respiratory: Normal respiratory effort, no problems with respiration noted  Abdomen: Soft, gravid, appropriate for gestational age. Pain/Pressure: Present     Pelvic:  Cervical exam performed        Extremities: Normal range of motion.  Edema: None  Mental Status: Normal mood and affect. Normal behavior. Normal judgment and thought content.   Urinalysis:      Assessment and Plan:  Pregnancy: X5T7001 at [redacted]w[redacted]d  1. Supervision of high risk pregnancy, antepartum Labor Precautions GBS and vaginal cultures collected today  2. History of preterm delivery, currently pregnant No evidence today Last 17 OHP given today  3. History of pre-eclampsia No S/Sx today Continue with BASA  4. History of IUFD NST reactive today  IOL next week at 37 weeks  Preterm labor symptoms and general obstetric precautions including but  not limited to vaginal bleeding, contractions, leaking of fluid and fetal movement were reviewed in detail with the patient. Please refer to After Visit Summary for other counseling recommendations.  Return in about 4 weeks (around 06/23/2019).   Hermina Staggers, MD

## 2019-05-26 NOTE — Telephone Encounter (Signed)
Preadmission screen  

## 2019-05-30 ENCOUNTER — Other Ambulatory Visit (HOSPITAL_COMMUNITY): Payer: Self-pay | Admitting: Advanced Practice Midwife

## 2019-05-30 LAB — CULTURE, BETA STREP (GROUP B ONLY): Strep Gp B Culture: NEGATIVE

## 2019-05-30 LAB — CERVICOVAGINAL ANCILLARY ONLY
Chlamydia: NEGATIVE
Comment: NEGATIVE
Comment: NORMAL
Neisseria Gonorrhea: NEGATIVE

## 2019-06-01 ENCOUNTER — Other Ambulatory Visit: Payer: Self-pay | Admitting: Advanced Practice Midwife

## 2019-06-01 ENCOUNTER — Other Ambulatory Visit (HOSPITAL_COMMUNITY)
Admission: RE | Admit: 2019-06-01 | Discharge: 2019-06-01 | Disposition: A | Discharge status: Home / Self Care | Payer: Medicaid Other | Source: Ambulatory Visit | Attending: Family Medicine | Admitting: Family Medicine

## 2019-06-01 DIAGNOSIS — Z20822 Contact with and (suspected) exposure to covid-19: Secondary | ICD-10-CM | POA: Diagnosis not present

## 2019-06-01 DIAGNOSIS — Z01812 Encounter for preprocedural laboratory examination: Secondary | ICD-10-CM | POA: Diagnosis present

## 2019-06-01 LAB — SARS CORONAVIRUS 2 (TAT 6-24 HRS): SARS Coronavirus 2: NEGATIVE

## 2019-06-02 ENCOUNTER — Encounter: Payer: Self-pay | Admitting: *Deleted

## 2019-06-02 ENCOUNTER — Ambulatory Visit: Payer: Medicaid Other | Admitting: *Deleted

## 2019-06-02 ENCOUNTER — Other Ambulatory Visit: Payer: Self-pay

## 2019-06-02 ENCOUNTER — Ambulatory Visit (HOSPITAL_BASED_OUTPATIENT_CLINIC_OR_DEPARTMENT_OTHER): Payer: Medicaid Other

## 2019-06-02 ENCOUNTER — Ambulatory Visit (HOSPITAL_COMMUNITY): Payer: Medicaid Other

## 2019-06-02 DIAGNOSIS — Z3A36 36 weeks gestation of pregnancy: Secondary | ICD-10-CM

## 2019-06-02 DIAGNOSIS — O09219 Supervision of pregnancy with history of pre-term labor, unspecified trimester: Secondary | ICD-10-CM

## 2019-06-02 DIAGNOSIS — O09293 Supervision of pregnancy with other poor reproductive or obstetric history, third trimester: Secondary | ICD-10-CM | POA: Diagnosis not present

## 2019-06-02 DIAGNOSIS — Z8759 Personal history of other complications of pregnancy, childbirth and the puerperium: Secondary | ICD-10-CM

## 2019-06-02 DIAGNOSIS — O0990 Supervision of high risk pregnancy, unspecified, unspecified trimester: Secondary | ICD-10-CM

## 2019-06-03 ENCOUNTER — Inpatient Hospital Stay (HOSPITAL_COMMUNITY)
Admission: AD | Admit: 2019-06-03 | Discharge: 2019-06-05 | DRG: 807 | Disposition: A | Discharge status: Home / Self Care | Payer: Medicaid Other | Attending: Obstetrics & Gynecology | Admitting: Obstetrics & Gynecology

## 2019-06-03 ENCOUNTER — Inpatient Hospital Stay (HOSPITAL_COMMUNITY): Payer: Medicaid Other

## 2019-06-03 ENCOUNTER — Other Ambulatory Visit: Payer: Self-pay

## 2019-06-03 ENCOUNTER — Encounter (HOSPITAL_COMMUNITY): Payer: Self-pay | Admitting: Obstetrics & Gynecology

## 2019-06-03 DIAGNOSIS — Z3A37 37 weeks gestation of pregnancy: Secondary | ICD-10-CM

## 2019-06-03 DIAGNOSIS — Z87891 Personal history of nicotine dependence: Secondary | ICD-10-CM | POA: Diagnosis not present

## 2019-06-03 DIAGNOSIS — Z8759 Personal history of other complications of pregnancy, childbirth and the puerperium: Secondary | ICD-10-CM

## 2019-06-03 DIAGNOSIS — D563 Thalassemia minor: Secondary | ICD-10-CM | POA: Diagnosis present

## 2019-06-03 DIAGNOSIS — O09899 Supervision of other high risk pregnancies, unspecified trimester: Secondary | ICD-10-CM

## 2019-06-03 DIAGNOSIS — O26893 Other specified pregnancy related conditions, third trimester: Principal | ICD-10-CM | POA: Diagnosis present

## 2019-06-03 DIAGNOSIS — O0990 Supervision of high risk pregnancy, unspecified, unspecified trimester: Secondary | ICD-10-CM

## 2019-06-03 LAB — COMPREHENSIVE METABOLIC PANEL
ALT: 7 U/L (ref 0–44)
AST: 17 U/L (ref 15–41)
Albumin: 2.4 g/dL — ABNORMAL LOW (ref 3.5–5.0)
Alkaline Phosphatase: 135 U/L — ABNORMAL HIGH (ref 38–126)
Anion gap: 8 (ref 5–15)
BUN: 6 mg/dL (ref 6–20)
CO2: 17 mmol/L — ABNORMAL LOW (ref 22–32)
Calcium: 8.3 mg/dL — ABNORMAL LOW (ref 8.9–10.3)
Chloride: 107 mmol/L (ref 98–111)
Creatinine, Ser: 0.61 mg/dL (ref 0.44–1.00)
GFR calc Af Amer: >60 mL/min (ref >60)
GFR calc non Af Amer: >60 mL/min (ref >60)
Glucose, Bld: 78 mg/dL (ref 70–99)
Potassium: 3.8 mmol/L (ref 3.5–5.1)
Sodium: 132 mmol/L — ABNORMAL LOW (ref 135–145)
Total Bilirubin: 0.5 mg/dL (ref 0.3–1.2)
Total Protein: 5.9 g/dL — ABNORMAL LOW (ref 6.5–8.1)

## 2019-06-03 LAB — CBC
HCT: 27.7 % — ABNORMAL LOW (ref 36.0–46.0)
Hemoglobin: 8.1 g/dL — ABNORMAL LOW (ref 12.0–15.0)
MCH: 20 pg — ABNORMAL LOW (ref 26.0–34.0)
MCHC: 29.2 g/dL — ABNORMAL LOW (ref 30.0–36.0)
MCV: 68.4 fL — ABNORMAL LOW (ref 80.0–100.0)
Platelets: 299 10*3/uL (ref 150–400)
RBC: 4.05 MIL/uL (ref 3.87–5.11)
RDW: 19.5 % — ABNORMAL HIGH (ref 11.5–15.5)
WBC: 6.6 10*3/uL (ref 4.0–10.5)
nRBC: 0.3 % — ABNORMAL HIGH (ref 0.0–0.2)

## 2019-06-03 LAB — PROTEIN / CREATININE RATIO, URINE
Creatinine, Urine: 84.49 mg/dL
Protein Creatinine Ratio: 0.09 mg/mg{Cre} (ref 0.00–0.15)
Total Protein, Urine: 8 mg/dL

## 2019-06-03 LAB — RPR: RPR Ser Ql: NONREACTIVE

## 2019-06-03 LAB — PREPARE RBC (CROSSMATCH)

## 2019-06-03 MED ORDER — ONDANSETRON HCL 4 MG/2ML IJ SOLN
4.0000 mg | Freq: Four times a day (QID) | INTRAMUSCULAR | Status: DC | PRN
  Administered 2019-06-03: 18:00:00 4 mg via INTRAVENOUS
  Filled 2019-06-03: qty 2

## 2019-06-03 MED ORDER — DIPHENHYDRAMINE HCL 50 MG/ML IJ SOLN: 12.5000 mg | INTRAMUSCULAR | Status: DC | PRN

## 2019-06-03 MED ORDER — WITCH HAZEL-GLYCERIN EX PADS: 1.0000 "application " | MEDICATED_PAD | CUTANEOUS | Status: DC | PRN

## 2019-06-03 MED ORDER — OXYTOCIN BOLUS FROM INFUSION
500.0000 mL | Freq: Once | INTRAVENOUS | Status: AC
  Administered 2019-06-03: 21:00:00 500 mL via INTRAVENOUS

## 2019-06-03 MED ORDER — TERBUTALINE SULFATE 1 MG/ML IJ SOLN: 0.2500 mg | Freq: Once | INTRAMUSCULAR | Status: DC | PRN

## 2019-06-03 MED ORDER — TRANEXAMIC ACID-NACL 1000-0.7 MG/100ML-% IV SOLN
1000.0000 mg | Freq: Once | INTRAVENOUS | Status: AC
  Administered 2019-06-03: 1000 mg via INTRAVENOUS
  Filled 2019-06-03: qty 100

## 2019-06-03 MED ORDER — DIBUCAINE (PERIANAL) 1 % EX OINT: 1.0000 "application " | TOPICAL_OINTMENT | CUTANEOUS | Status: DC | PRN

## 2019-06-03 MED ORDER — LACTATED RINGERS IV SOLN: INTRAVENOUS | Status: DC

## 2019-06-03 MED ORDER — SODIUM CHLORIDE 0.9 % IV SOLN
510.0000 mg | Freq: Once | INTRAVENOUS | Status: AC
  Administered 2019-06-04: 510 mg via INTRAVENOUS
  Filled 2019-06-03 (×2): qty 17

## 2019-06-03 MED ORDER — OXYTOCIN 40 UNITS IN NORMAL SALINE INFUSION - SIMPLE MED: 2.5000 [IU]/h | INTRAVENOUS | Status: DC

## 2019-06-03 MED ORDER — PRENATAL MULTIVITAMIN CH
1.0000 | ORAL_TABLET | Freq: Every day | ORAL | Status: DC
  Administered 2019-06-04 – 2019-06-05 (×2): 1 via ORAL
  Filled 2019-06-03 (×2): qty 1

## 2019-06-03 MED ORDER — ACETAMINOPHEN 325 MG PO TABS
650.0000 mg | ORAL_TABLET | ORAL | Status: DC | PRN
  Administered 2019-06-04: 22:00:00 650 mg via ORAL
  Filled 2019-06-03 (×2): qty 2

## 2019-06-03 MED ORDER — COCONUT OIL OIL: 1.0000 "application " | TOPICAL_OIL | Status: DC | PRN

## 2019-06-03 MED ORDER — OXYTOCIN 40 UNITS IN NORMAL SALINE INFUSION - SIMPLE MED
1.0000 m[IU]/min | INTRAVENOUS | Status: DC
  Administered 2019-06-03: 18:00:00 2 m[IU]/min via INTRAVENOUS
  Filled 2019-06-03: qty 1000

## 2019-06-03 MED ORDER — MAGNESIUM HYDROXIDE 400 MG/5ML PO SUSP: 30.0000 mL | ORAL | Status: DC | PRN

## 2019-06-03 MED ORDER — MISOPROSTOL 50MCG HALF TABLET
50.0000 ug | ORAL_TABLET | Freq: Once | ORAL | Status: AC
  Administered 2019-06-03: 50 ug via BUCCAL
  Filled 2019-06-03: qty 1

## 2019-06-03 MED ORDER — SOD CITRATE-CITRIC ACID 500-334 MG/5ML PO SOLN: 30.0000 mL | ORAL | Status: DC | PRN

## 2019-06-03 MED ORDER — ACETAMINOPHEN 325 MG PO TABS: 650.0000 mg | ORAL_TABLET | ORAL | Status: DC | PRN

## 2019-06-03 MED ORDER — SODIUM CHLORIDE 0.9% IV SOLUTION: Freq: Once | INTRAVENOUS | Status: DC

## 2019-06-03 MED ORDER — FENTANYL-BUPIVACAINE-NACL 0.5-0.125-0.9 MG/250ML-% EP SOLN: 12.0000 mL/h | EPIDURAL | Status: DC | PRN

## 2019-06-03 MED ORDER — EPHEDRINE 5 MG/ML INJ: 10.0000 mg | INTRAVENOUS | Status: DC | PRN

## 2019-06-03 MED ORDER — ONDANSETRON HCL 4 MG/2ML IJ SOLN: 4.0000 mg | INTRAMUSCULAR | Status: DC | PRN

## 2019-06-03 MED ORDER — BENZOCAINE-MENTHOL 20-0.5 % EX AERO
1.0000 "application " | INHALATION_SPRAY | CUTANEOUS | Status: DC | PRN
  Administered 2019-06-03: 1 via TOPICAL
  Filled 2019-06-03: qty 56

## 2019-06-03 MED ORDER — SIMETHICONE 80 MG PO CHEW: 80.0000 mg | CHEWABLE_TABLET | ORAL | Status: DC | PRN

## 2019-06-03 MED ORDER — LIDOCAINE HCL (PF) 1 % IJ SOLN
30.0000 mL | INTRAMUSCULAR | Status: AC | PRN
  Administered 2019-06-03: 21:00:00 30 mL via SUBCUTANEOUS
  Filled 2019-06-03: qty 30

## 2019-06-03 MED ORDER — FENTANYL CITRATE (PF) 100 MCG/2ML IJ SOLN
100.0000 ug | INTRAMUSCULAR | Status: DC | PRN
  Administered 2019-06-03 (×4): 100 ug via INTRAVENOUS
  Filled 2019-06-03 (×4): qty 2

## 2019-06-03 MED ORDER — OXYCODONE HCL 5 MG PO TABS
10.0000 mg | ORAL_TABLET | ORAL | Status: DC | PRN
  Administered 2019-06-04: 10 mg via ORAL
  Filled 2019-06-03: qty 2

## 2019-06-03 MED ORDER — OXYCODONE-ACETAMINOPHEN 5-325 MG PO TABS
2.0000 | ORAL_TABLET | ORAL | Status: DC | PRN
  Filled 2019-06-03: qty 2

## 2019-06-03 MED ORDER — PHENYLEPHRINE 40 MCG/ML (10ML) SYRINGE FOR IV PUSH (FOR BLOOD PRESSURE SUPPORT): 80.0000 ug | PREFILLED_SYRINGE | INTRAVENOUS | Status: DC | PRN

## 2019-06-03 MED ORDER — ONDANSETRON HCL 4 MG PO TABS: 4.0000 mg | ORAL_TABLET | ORAL | Status: DC | PRN

## 2019-06-03 MED ORDER — MISOPROSTOL 25 MCG QUARTER TABLET
25.0000 ug | ORAL_TABLET | ORAL | Status: DC | PRN
  Administered 2019-06-03: 25 ug via VAGINAL
  Filled 2019-06-03: qty 1

## 2019-06-03 MED ORDER — DIPHENHYDRAMINE HCL 25 MG PO CAPS: 25.0000 mg | ORAL_CAPSULE | Freq: Four times a day (QID) | ORAL | Status: DC | PRN

## 2019-06-03 MED ORDER — LACTATED RINGERS IV SOLN: 500.0000 mL | Freq: Once | INTRAVENOUS | Status: DC

## 2019-06-03 MED ORDER — OXYCODONE-ACETAMINOPHEN 5-325 MG PO TABS
1.0000 | ORAL_TABLET | ORAL | Status: DC | PRN
  Administered 2019-06-03: 1 via ORAL

## 2019-06-03 MED ORDER — OXYCODONE HCL 5 MG PO TABS
5.0000 mg | ORAL_TABLET | ORAL | Status: DC | PRN
  Administered 2019-06-04 (×3): 5 mg via ORAL
  Filled 2019-06-03 (×3): qty 1

## 2019-06-03 MED ORDER — SENNOSIDES-DOCUSATE SODIUM 8.6-50 MG PO TABS
2.0000 | ORAL_TABLET | ORAL | Status: DC
  Administered 2019-06-03 – 2019-06-04 (×2): 2 via ORAL
  Filled 2019-06-03 (×2): qty 2

## 2019-06-03 MED ORDER — IBUPROFEN 600 MG PO TABS
600.0000 mg | ORAL_TABLET | Freq: Four times a day (QID) | ORAL | Status: DC
  Administered 2019-06-03 – 2019-06-05 (×7): 600 mg via ORAL
  Filled 2019-06-03 (×7): qty 1

## 2019-06-03 MED ORDER — LACTATED RINGERS IV SOLN: 500.0000 mL | INTRAVENOUS | Status: DC | PRN

## 2019-06-03 NOTE — Plan of Care (Signed)
L&D careplan completed 

## 2019-06-03 NOTE — Progress Notes (Signed)
Labor Progress Note Kendra Fields is a 29 y.o. W7D2524 at [redacted]w[redacted]d presented for IOL for hx of IUFD.    S: Patient tolerating CTXs. Wanting to avoid epidural.   O:  BP 109/78   Pulse 96   Temp 98.4 F (36.9 C) (Oral)   Resp 16   Ht 5\' 4"  (1.626 m)   Wt 72.9 kg   LMP 09/17/2018   BMI 27.58 kg/m  EFM: 145/mod variability/pos accels, no decels  TOCO: every 2-3 minutes   CVE: Dilation: 1.5 Effacement (%): 50 Cervical Position: Middle Station: -2 Presentation: Vertex Exam by:: Dr. [email or web address]   [name]: 29 y.o. 26 [redacted]w[redacted]d here for IOL for hx of IUFD. #Labor: Progressing s/p Cytotec. Bishop score 5.  Will give additional Cytotec.  Foley bulb discussed and patient agreeable.  FB placed successfully. Reassess in 4 hours.   #Pain: IV pain meds per patient request  #FWB: Cat 1 #GBS negative  #hx of IUFD x2:  Patient declines speaking with chaplain.   Priseis Cratty, DO 1:15 PM

## 2019-06-03 NOTE — H&P (Addendum)
OBSTETRIC ADMISSION HISTORY AND PHYSICAL  Kendra Fields is a 29 y.o. female [phone] with IUP at 24w0dby LMP presenting for IOL for hx of IUFD. She reports +FMs, No LOF, no VB, no blurry vision, headaches or peripheral edema, and RUQ pain.  She plans on breast feeding. She requests the patch for birth control. 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 Chlamydia December 2016  . Pregnancy induced hypertension   . Preterm labor   . Trichomonas vaginitis December 2016    Past Surgical History: Past Surgical History:  Procedure Laterality Date  . NO PAST SURGERIES      Obstetrical History: OB History    Gravida  5   Para  3   Term  0   Preterm  3   AB  1   Living  2     SAB  0   TAB  0   Ectopic  0   Multiple  1   Live Births  2           Social History Social History   Socioeconomic History  . Marital status: Single    Spouse name: Not on file  . Number of children: Not on file  . Years of education: Not on file  . Highest education level: Not on file  Occupational History  . Occupation: Direct  Tobacco Use  . Smoking status: Former Smoker    Types: Cigarettes  . Smokeless tobacco: Never Used  Substance and Sexual Activity  . Alcohol use: Not Currently    Comment: occassionally  . Drug use: No  . Sexual activity: Yes    Birth control/protection: None  Other Topics Concern  . Not on file  Social History Narrative  . Not on file   Social Determinants of Health   Financial Resource Strain:   . Difficulty of Paying Living Expenses:   Food Insecurity:   .  Worried About RCharity fundraiserin the Last Year:   . RArboriculturistin the Last Year:   Transportation Needs:   . LFilm/video editor(Medical):   .Marland KitchenLack of Transportation (Non-Medical):   Physical Activity:   . Days of Exercise per Week:   . Minutes of Exercise per Session:   Stress:   . Feeling of Stress :   Social Connections:   . Frequency of Communication with Friends and Family:   . Frequency of Social Gatherings with Friends and Family:   . Attends Religious Services:   . Active Member of Clubs or Organizations:   . Attends CArchivistMeetings:   .Marland KitchenMarital Status:     Family History: Family History  Problem Relation Age of Onset  . Alcohol abuse Mother   . Asthma Brother   . Stroke Maternal Uncle   . Cancer Maternal Grandmother        brain  . Diabetes Maternal Grandmother   . Diabetes Paternal Grandmother   . Hypertension Paternal Grandmother     Allergies: Allergies  Allergen Reactions  . Penicillins Other (See Comments)  Childhood reaction---unknown    Facility-Administered Medications Prior to Admission  Medication Dose Route Frequency Provider Last Rate Last Admin  . HYDROXYprogesterone Caproate SOAJ 275 mg  275 mg Subcutaneous Weekly Shelly Bombard, MD   275 mg at 05/26/19 1451   Medications Prior to Admission  Medication Sig Dispense Refill Last Dose  . acetaminophen (TYLENOL) 325 MG tablet Take 325 mg by mouth as needed for moderate pain or headache.    Past Month at Unknown time  . aspirin 81 MG chewable tablet Chew 2 tablets (162 mg total) by mouth daily. 60 tablet 5 06/02/2019 at Unknown time  . diphenhydramine-acetaminophen (TYLENOL PM) 25-500 MG TABS tablet Take 1 tablet by mouth at bedtime as needed (sleep aid).   06/02/2019 at Unknown time  . Prenat w/o A-FeCbn-Meth-FA-DHA (PRENATE MINI) 29-0.6-0.4-350 MG CAPS Take 1 capsule by mouth daily before breakfast. 90 capsule 3 06/02/2019 at Unknown time  . Blood Pressure Monitoring  (BLOOD PRESSURE KIT) DEVI 1 kit by Does not apply route once a week. Check BP regularly and record readings in the [employer] App. Large Cuff. DX O90.0 1 each 0   . Elastic Bandages & Supports (COMFORT FIT MATERNITY SUPP LG) MISC Wear daily when ambulating (Patient not taking: Reported on 05/11/2019) 1 each 0      Review of Systems  All systems reviewed and negative except as stated in HPI  Blood pressure 114/75, pulse 91, temperature 98.3 F (36.8 C), temperature source Oral, resp. rate 16, height 5' 4"  (1.626 m), weight 72.9 kg, last menstrual period 09/17/2018, unknown if currently breastfeeding.  General appearance: alert and no distress Lungs: clear to auscultation bilaterally Heart: regular rate and rhythm Abdomen: soft, non-tender; bowel sounds normal Pelvic: gravid uterus Extremities: Homans sign is negative, no sign of DVT Presentation: cephalic Fetal monitoringBaseline: 145 bpm, Variability: Good {> 6 bpm), Accelerations: Reactive and Decelerations: Absent Uterine activityFrequency: irregular Dilation: 1 Effacement (%): 50 Station: -3 Exam by:: Remus Blake RN   Prenatal labs: ABO, Rh: O/Positive/-- (01/05 1122) Antibody: Positive, See Final Results (01/05 1122) Rubella: 1.91 (01/05 1122) RPR: Non Reactive (03/03 0950)  HBsAg: Negative (01/05 1122)  HIV: Non Reactive (03/03 0950)  GBS: Negative/-- (04/29 0329)  2 hr Glucola (81, 91, 93) Genetic screening  LR NIPS Anatomy US normal  Prenatal Transfer Tool  Maternal Diabetes: No Genetic Screening: Normal Maternal Ultrasounds/Referrals: Normal Fetal Ultrasounds or other Referrals:  None Maternal Substance Abuse:  No Significant Maternal Medications:  None Significant Maternal Lab Results: Group B Strep negative  Results for orders placed or performed during the hospital encounter of 06/03/19 (from the past 24 hour(s))  CBC   Collection Time: 06/03/19  7:54 AM  Result Value Ref Range   WBC 6.6 4.0 - 10.5 K/uL    RBC 4.05 3.87 - 5.11 MIL/uL   Hemoglobin 8.1 (L) 12.0 - 15.0 g/dL   HCT 27.7 (L) 36.0 - 46.0 %   MCV 68.4 (L) 80.0 - 100.0 fL   MCH 20.0 (L) 26.0 - 34.0 pg   MCHC 29.2 (L) 30.0 - 36.0 g/dL   RDW 19.5 (H) 11.5 - 15.5 %   Platelets 299 150 - 400 K/uL   nRBC 0.3 (H) 0.0 - 0.2 %    Patient Active Problem List   Diagnosis Date Noted  . History of IUFD 02/09/2019  . History of pre-eclampsia 02/01/2019  . History of preterm delivery, currently pregnant 02/01/2019  . Supervision of high risk pregnancy, antepartum 02/01/2019    Assessment/Plan:  Kendra Fields is a 28 y.o. [id number] at 19w0dhere for IOL for hx of IUFD x2 in third trimester.   #Labor:  Admit L&D. Discussed risks and benefits of labor induction (Cytotec, Foley bulb/Cooks catheter, AROM and pitocin.  Cytotec given [phone] Reassess in 4 hours. Plan for FB placement at that time. #Pain: Per pt request  #FWB: Cat 1 #ID:  GBS negative #MOF: breast #MOC: patch #Circ:  Yes  #hx of pre-E:  Pre-E labs pending.   VLyndee Hensen DO  06/03/2019, 8:45 AM  GME ATTESTATION:  I saw and evaluated the patient. I agree with the findings and the plan of care as documented in the resident's note with addition of the following:  Risks and benefits of induction were reviewed, including failure of method, prolonged labor, need for further intervention, risk of cesarean.  Patient and family seem to understand these risks and wish to proceed. Options of cytotec, foley bulb, AROM, and pitocin reviewed, with use of each discussed.  HMerilyn Baba DO OB Fellow, FDoffingfor WSandy Springs5/07/2019 10:45 AM

## 2019-06-03 NOTE — Progress Notes (Signed)
Labor Progress Note Kendra Fields is a 29 y.o. V9Y7215 at [redacted]w[redacted]d presented for IOL for hx of IUFD.    S: Patient doing well and is tolerating CTXs. Reports +FM.Marland Kitchen   O:  BP 107/70   Pulse 88   Temp 98.4 F (36.9 C) (Oral)   Resp 14   Ht 5\' 4"  (1.626 m)   Wt 72.9 kg   LMP 09/17/2018   BMI 27.58 kg/m  EFM: 140 / moderate variability / pos accels, no decels  TOCO: irregular   Dilation: 4 Effacement (%): 60 Cervical Position: Middle Station: -2 Presentation: Vertex Exam by:: Dr. [email or web address]    [name]: 29 y.o. 26 [redacted]w[redacted]d here for IOL for hx of IUFD. #Labor: Progressing s/p Cytotec x2 and foley bulb.  AROM discussed and patient agreeable.  AROM successful. Pitocin started 1740.  Continue pitocin.  #Pain: IV pain meds per patient request, wants to avoid epidural #FWB: Cat 1 #GBS negative  #hx of IUFD x2:  Patient declined speaking with chaplain.   [redacted]w[redacted]d, DO 6:27 PM

## 2019-06-03 NOTE — Discharge Summary (Signed)
Postpartum Discharge Summary    Patient Name: Kendra Fields DOB: April 23, 1990 MRN: [id number]  Date of admission: 06/03/2019 Delivering Provider: Clarnce Flock   Date of discharge: 06/05/2019  Admitting diagnosis: History of IUFD [Z87.59] Intrauterine pregnancy: [redacted]w[redacted]d    Secondary diagnosis:  Active Problems:   History of pre-eclampsia   History of preterm delivery, currently pregnant   Supervision of high risk pregnancy, antepartum   History of IUFD   NSVD (normal spontaneous vaginal delivery)  Additional problems: n/a     Discharge diagnosis: Term Pregnancy Delivered                                                                                                Post partum procedures:n/a  Augmentation: AROM, Pitocin, Cytotec and Foley Balloon  Complications: None  Hospital course:  Induction of Labor With Vaginal Delivery   29y.o. yo [id number] admitted to the hospital 06/03/2019 for induction of labor.  Indication for induction: history of two prior IUFD.  Patient had an uncomplicated labor course as follows: Patient arrived at 1 cm dilation and was induced with foley bulb, misoprostol x2, pitocin, and AROM, and progressed to NSVD over approximately 8 hours. Membrane Rupture Time/Date: 6:12 PM ,06/03/2019   Intrapartum Procedures: Episiotomy: None [1]                                         Lacerations:  2nd degree [3];Periurethral [8]  Patient had delivery of a Viable infant.  Information for the patient's newborn:  CrLandyn, Lorincz0[[id number]]Delivery Method: Vag-Spont    06/03/2019  Details of delivery can be found in separate delivery note.  Patient had a routine postpartum course. Patient is discharged home 06/05/19. Delivery time: 8:38 PM    Magnesium Sulfate received: No BMZ received: No Rhophylac:N/A MMR:N/A Transfusion:No  Physical exam  Vitals:   06/04/19 0720 06/04/19 1100 06/04/19 2125 06/05/19 0540  BP: 100/70 108/82 113/82 122/88   Pulse: 83 70 85 70  Resp: _0 Temp: 98.2 F (36.8 C) (!) 97.4 F (36.3 C) 98 F (36.7 C) 98.2 F (36.8 C)  TempSrc: Oral Oral Oral Oral  SpO2: 100%  100% 100%  Weight:      Height:       General: alert, cooperative and no distress Lochia: appropriate Uterine Fundus: firm Incision: N/A DVT Evaluation: No evidence of DVT seen on physical exam. Labs: Lab Results  Component Value Date   WBC 6.6 06/03/2019   HGB 8.1 (L) 06/03/2019   HCT 27.7 (L) 06/03/2019   MCV 68.4 (L) 06/03/2019   PLT 299 06/03/2019   CMP Latest Ref Rng & Units 06/03/2019  Glucose 70 - 99 mg/dL 78  BUN 6 - 20 mg/dL 6  Creatinine 0.44 - 1.00 mg/dL 0.61  Sodium 135 - 145 mmol/L 132(L)  Potassium 3.5 - 5.1 mmol/L 3.8  Chloride 98 - 111 mmol/L 107  CO2 22 - 32 mmol/L 17(L)  Calcium 8.9 - 10.3 mg/dL 8.3(L)  Total Protein 6.5 - 8.1 g/dL 5.9(L)  Total Bilirubin 0.3 - 1.2 mg/dL 0.5  Alkaline Phos 38 - 126 U/L 135(H)  AST 15 - 41 U/L 17  ALT 0 - 44 U/L 7   Edinburgh Score: Edinburgh Postnatal Depression Scale Screening Tool 06/04/2019  I have been able to laugh and see the funny side of things. 0  I have looked forward with enjoyment to things. 0  I have blamed myself unnecessarily when things went wrong. 0  I have been anxious or worried for no good reason. 0  I have felt scared or panicky for no good reason. 0  Things have been getting on top of me. 0  I have been so unhappy that I have had difficulty sleeping. 0  I have felt sad or miserable. 0  I have been so unhappy that I have been crying. 0  The thought of harming myself has occurred to me. 0  Edinburgh Postnatal Depression Scale Total 0    Discharge instruction: per After Visit Summary and "Baby and Me Booklet".  After visit meds:  Allergies as of 06/05/2019      Reactions   Penicillins Other (See Comments)   Childhood reaction---unknown      Medication List    STOP taking these medications   aspirin 81 MG chewable tablet    [place] these medications   Blood Pressure Kit Devi 1 kit by Does not apply route once a week. Check BP regularly and record readings in the [employer]s App. Large Cuff. DX O90.0   diphenhydramine-acetaminophen 25-500 MG Tabs tablet Commonly known as: TYLENOL PM Take 1 tablet by mouth at bedtime as needed (sleep aid).   ibuprofen 600 MG tablet Commonly known as: ADVIL Take 1 tablet (600 mg total) by mouth every 6 (six) hours.   Prenate Mini 29-0.6-0.4-350 MG Caps Take 1 capsule by mouth daily before breakfast.   Tylenol 325 MG tablet Generic drug: acetaminophen Take 325 mg by mouth as needed for moderate pain or headache.       Diet: routine diet  Activity: Advance as tolerated. Pelvic rest for 6 weeks.   Outpatient follow up:6 weeks Follow up Appt:No future appointments. Follow up Visit: [facility].   Why: in 4 weeks for a postpartum visit Contact information: [address] [place] [id number] [phone]          Please schedule this patient for Postpartum visit in: 6 weeks with the following provider: Any provider Virtual For C/S patients schedule nurse incision check in weeks 2 weeks: no High risk pregnancy complicated by: history of two prior IUFD Delivery mode:  SVD Anticipated Birth Control:  Patch PP Procedures needed: none  Schedule Integrated BH visit: no   Newborn Data: Live born female  Birth Weight:  2611g APGAR: 89, 9  Newborn Delivery   Birth date/time: 06/03/2019 20:38:00 Delivery type: Vaginal, Spontaneous      Baby Feeding: Breast Disposition:home with mother   06/05/2019 Wende Mott, CNM

## 2019-06-04 NOTE — Progress Notes (Signed)
Post Partum Day 1 SVD Subjective: no complaints, up ad lib, voiding and tolerating PO Patient does not report any acute events overnight   Objective: Blood pressure 100/70, pulse 83, temperature 98.2 F (36.8 C), temperature source Oral, resp. rate 16, height 5\' 4"  (1.626 m), weight 72.9 kg, last menstrual period 09/17/2018, SpO2 100 %, unknown if currently breastfeeding.  Physical Exam:  General: alert and cooperative Lochia: appropriate Uterine Fundus: firm Incision: N/A DVT Evaluation: No evidence of DVT seen on physical exam. No cords or calf tenderness. No significant calf/ankle edema.  Recent Labs    06/03/19 0754  HGB 8.1*  HCT 27.7*    Assessment/Plan: Plan for discharge tomorrow, Breastfeeding, Circumcision prior to discharge and Contraception planning patch  IV Fereheme received    LOS: 1 day   08/03/19 06/04/2019, 10:40 AM

## 2019-06-04 NOTE — Lactation Note (Signed)
This note was copied from a baby's chart. Lactation Consultation Note Baby is 8 hrs old. Mom's choice to pump and bottle feed for 2 weeks to 1 month. Mom has inverted nipples. Mom has twins that are 31 yrs old she pumped and bottle fed then for a month. Mom has DEBP at home.  Mom's breast are full looking and feeling. Lt. Breast is bigger than Rt. Breast. Both breast has some hard knots. Mom stated Lt. 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 Mom stated her breast are knotty and full feeling. Mom denies implants.  Mom shown how to use DEBP & how to disassemble, clean, & reassemble parts. Mom knows to pump q3h for 15-20 min. #27 flanges used d/t #24 appeared to tight. Mom encouraged to feed baby 8-12 times/24 hours and with feeding cues.  Mom pumping when LC left. Nothing collected yet.  LPI information sheet given. Explained that baby will not supplement but will go by formula feeding amounts. Mom stated understanding. Reviewed LPI sheet on behavior of ETI.  Mom is giving baby Similac 22 cal. Formula. Discussed importance of STS, I&O, breast massage, milk storage, supply and demand. Encouraged mom to call for assistance or questions. Lactation brochure given.  Patient Name: Boy Donnis Pecha [id number] Date: 06/04/2019 Reason for consult: Initial assessment;Infant < 6lbs;Early term 37-38.6wks   Maternal Data Has patient been taught Hand Expression?: Yes Does the patient have breastfeeding experience prior to this delivery?: Yes  Feeding    LATCH Score       Type of Nipple: Inverted           Interventions Interventions: Breast massage;Hand express;Breast compression;DEBP;Hand pump  Lactation Tools Discussed/Used Tools: Pump Breast pump type: Double-Electric Breast Pump WIC Program: Yes Pump Review: Setup, frequency, and cleaning;Milk Storage Initiated by:: Peri Jefferson RN IBCLC Date initiated:: 06/04/19   Consult Status Consult Status:  Follow-up Date: 06/05/19 Follow-up type: In-patient    Keyoni Lapinski, Diamond Nickel 06/04/2019, 5:02 AM

## 2019-06-04 NOTE — Lactation Note (Signed)
This note was copied from a baby's chart. Lactation Consultation Note  Patient Name: Kendra Fields Date: 06/04/2019  Mom reports she has not used the DEBP yet.  [name] Kendra Fields now 15 hours old.  Mom reports she has decided to formula feed only.  Does not plan to pump or breastfeed.   Reviewed formula prep guidelines with mom.  Reviewed CDC recommendations to boil water mixed with powdered formula.  Gave mom handout. Urged her to go to [employer] with any questions.  Urged STS even if not Breastfeeding.  Reviewed AAP recommendations for STS.  Urged at least 6 hours day for first two weeks of life.Urged mom to call lactation if needed.  Mom has [facility] breastfeeding [facility]ation [facility] Handout  Maternal Data    Feeding Feeding Type: Formula Nipple Type: Slow - flow  LATCH Score                   Interventions    Lactation Tools Discussed/Used     Consult Status      Kendra Fields 06/04/2019, 1:40 PM

## 2019-06-05 MED ORDER — IBUPROFEN 600 MG PO TABS: 600.0000 mg | ORAL_TABLET | Freq: Four times a day (QID) | ORAL | 0 refills | Status: DC

## 2019-06-05 NOTE — Discharge Instructions (Signed)
Postpartum Care After Vaginal Delivery This sheet gives you information about how to care for yourself from the time you deliver your baby to up to 6-12 weeks after delivery (postpartum period). Your health care provider may also give you more specific instructions. If you have problems or questions, contact your health care provider. Follow these instructions at home: Vaginal bleeding  It is normal to have vaginal bleeding (lochia) after delivery. Wear a sanitary pad for vaginal bleeding and discharge. ? During the first week after delivery, the amount and appearance of lochia is often similar to a menstrual period. ? Over the next few weeks, it will gradually decrease to a dry, yellow-brown discharge. ? For most women, lochia stops completely by 4-6 weeks after delivery. Vaginal bleeding can vary from woman to woman.  Change your sanitary pads frequently. Watch for any changes in your flow, such as: ? A sudden increase in volume. ? A change in color. ? Large blood clots.  If you pass a blood clot from your vagina, save it and call your health care provider to discuss. Do not flush blood clots down the toilet before talking with your health care provider.  Do not use tampons or douches until your health care provider says this is safe.  If you are not breastfeeding, your period should return 6-8 weeks after delivery. If you are feeding your child breast milk only (exclusive breastfeeding), your period may not return until you stop breastfeeding. Perineal care  Keep the area between the vagina and the anus (perineum) clean and dry as told by your health care provider. Use medicated pads and pain-relieving sprays and creams as directed.  If you had a cut in the perineum (episiotomy) or a tear in the vagina, check the area for signs of infection until you are healed. Check for: ? More redness, swelling, or pain. ? Fluid or blood coming from the cut or tear. ? Warmth. ? Pus or a bad  smell.  You may be given a squirt bottle to use instead of wiping to clean the perineum area after you go to the bathroom. As you start healing, you may use the squirt bottle before wiping yourself. Make sure to wipe gently.  To relieve pain caused by an episiotomy, a tear in the vagina, or swollen veins in the anus (hemorrhoids), try taking a warm sitz bath 2-3 times a day. A sitz bath is a warm water bath that is taken while you are sitting down. The water should only come up to your hips and should cover your buttocks. Breast care  Within the first few days after delivery, your breasts may feel heavy, full, and uncomfortable (breast engorgement). Milk may also leak from your breasts. Your health care provider can suggest ways to help relieve the discomfort. Breast engorgement should go away within a few days.  If you are breastfeeding: ? Wear a bra that supports your breasts and fits you well. ? Keep your nipples clean and dry. Apply creams and ointments as told by your health care provider. ? You may need to use breast pads to absorb milk that leaks from your breasts. ? You may have uterine contractions every time you breastfeed for up to several weeks after delivery. Uterine contractions help your uterus return to its normal size. ? If you have any problems with breastfeeding, work with your health care provider or lactation consultant.  If you are not breastfeeding: ? Avoid touching your breasts a lot. Doing this can make   your breasts produce more milk. ? Wear a good-fitting bra and use cold packs to help with swelling. ? Do not squeeze out (express) milk. This causes you to make more milk. Intimacy and sexuality  Ask your health care provider when you can engage in sexual activity. This may depend on: ? Your risk of infection. ? How fast you are healing. ? Your comfort and desire to engage in sexual activity.  You are able to get pregnant after delivery, even if you have not had  your period. If desired, talk with your health care provider about methods of birth control (contraception). Medicines  Take over-the-counter and prescription medicines only as told by your health care provider.  If you were prescribed an antibiotic medicine, take it as told by your health care provider. Do not stop taking the antibiotic even if you start to feel better. Activity  Gradually return to your normal activities as told by your health care provider. Ask your health care provider what activities are safe for you.  Rest as much as possible. Try to rest or take a nap while your baby is sleeping. Eating and drinking   Drink enough fluid to keep your urine pale yellow.  Eat high-fiber foods every day. These may help prevent or relieve constipation. High-fiber foods include: ? Whole grain cereals and breads. ? Brown rice. ? Beans. ? Fresh fruits and vegetables.  Do not try to lose weight quickly by cutting back on calories.  Take your prenatal vitamins until your postpartum checkup or until your health care provider tells you it is okay to stop. Lifestyle  Do not use any products that contain nicotine or tobacco, such as cigarettes and e-cigarettes. If you need help quitting, ask your health care provider.  Do not drink alcohol, especially if you are breastfeeding. General instructions  Keep all follow-up visits for you and your baby as told by your health care provider. Most women visit their health care provider for a postpartum checkup within the first 3-6 weeks after delivery. Contact a health care provider if:  You feel unable to cope with the changes that your child brings to your life, and these feelings do not go away.  You feel unusually sad or worried.  Your breasts become red, painful, or hard.  You have a fever.  You have trouble holding urine or keeping urine from leaking.  You have little or no interest in activities you used to enjoy.  You have not  breastfed at all and you have not had a menstrual period for 12 weeks after delivery.  You have stopped breastfeeding and you have not had a menstrual period for 12 weeks after you stopped breastfeeding.  You have questions about caring for yourself or your baby.  You pass a blood clot from your vagina. Get help right away if:  You have chest pain.  You have difficulty breathing.  You have sudden, severe leg pain.  You have severe pain or cramping in your lower abdomen.  You bleed from your vagina so much that you fill more than one sanitary pad in one hour. Bleeding should not be heavier than your heaviest period.  You develop a severe headache.  You faint.  You have blurred vision or spots in your vision.  You have bad-smelling vaginal discharge.  You have thoughts about hurting yourself or your baby. If you ever feel like you may hurt yourself or others, or have thoughts about taking your own life, get help  right away. You can go to the nearest emergency department or call:  Your local emergency services (911 in the [place]  A suicide crisis helpline, such as the National Suicide Prevention Lifeline at (732)832-4550. This is open 24 hours a day. Summary  The period of time right after you deliver your newborn up to 6-12 weeks after delivery is called the postpartum period.  Gradually return to your normal activities as told by your health care provider.  Keep all follow-up visits for you and your baby as told by your health care provider. This information is not intended to replace advice given to you by your health care provider. Make sure you discuss any questions you have with your health care provider. Document Revised: 01/16/2017 Document Reviewed: 10/27/2016 Elsevier Patient Education  2020 [employer].    Contraception Choices Contraception, also called birth control, means things to use or ways to try not to get pregnant. Hormonal birth control This kind  of birth control uses hormones. Here are some types of hormonal birth control:  A tube that is put under skin of the arm (implant). The tube can stay in for as long as 3 years.  Shots to get every 3 months (injections).  Pills to take every day (birth control pills).  A patch to change 1 time each week for 3 weeks (birth control patch). After that, the patch is taken off for 1 week.  A ring to put in the vagina. The ring is left in for 3 weeks. Then it is taken out of the vagina for 1 week. Then a new ring is put in.  Pills to take after unprotected sex (emergency birth control pills). Barrier birth control Here are some types of barrier birth control:  A thin covering that is put on the penis before sex (female condom). The covering is thrown away after sex.  A soft, loose covering that is put in the vagina before sex (female condom). The covering is thrown away after sex.  A rubber bowl that sits over the cervix (diaphragm). The bowl must be made for you. The bowl is put into the vagina before sex. The bowl is left in for 6-8 hours after sex. It is taken out within 24 hours.  A small, soft cup that fits over the cervix (cervical cap). The cup must be made for you. The cup can be left in for 6-8 hours after sex. It is taken out within 48 hours.  A sponge that is put into the vagina before sex. It must be left in for at least 6 hours after sex. It must be taken out within 30 hours. Then it is thrown away.  A chemical that kills or stops sperm from getting into the uterus (spermicide). It may be a pill, cream, jelly, or foam to put in the vagina. The chemical should be used at least 10-15 minutes before sex. IUD (intrauterine) birth control An IUD is a small, T-shaped piece of plastic. It is put inside the uterus. There are two kinds:  Hormone IUD. This kind can stay in for 3-5 years.  Copper IUD. This kind can stay in for 10 years. Permanent birth control Here are some types of  permanent birth control:  Surgery to block the fallopian tubes.  Having an insert put into each fallopian tube.  Surgery to tie off the tubes that carry sperm (vasectomy). Natural planning birth control Here are some types of natural planning birth control:  Not having sex on  the days the woman could get pregnant.  Using a calendar: ? To keep track of the length of each period. ? To find out what days pregnancy can happen. ? To plan to not have sex on days when pregnancy can happen.  Watching for symptoms of ovulation and not having sex during ovulation. One way the woman can check for ovulation is to check her temperature.  Waiting to have sex until after ovulation. Summary  Contraception, also called birth control, means things to use or ways to try not to get pregnant.  Hormonal methods of birth control include implants, injections, pills, patches, vaginal rings, and emergency birth control pills.  Barrier methods of birth control can include female condoms, female condoms, diaphragms, cervical caps, sponges, and spermicides.  There are two types of IUD (intrauterine device) birth control. An IUD can be put in a woman's uterus to prevent pregnancy for 3-5 years.  Permanent sterilization can be done through a procedure for males, females, or both.  Natural planning methods involve not having sex on the days when the woman could get pregnant. This information is not intended to replace advice given to you by your health care provider. Make sure you discuss any questions you have with your health care provider. Document Revised: 05/05/2018 Document Reviewed: 01/24/2016 Elsevier Patient Education  2020 [employer].

## 2019-06-10 LAB — BPAM RBC
Blood Product Expiration Date: 202105182359
Blood Product Expiration Date: 202106102359
Blood Product Expiration Date: 202106152359
ISSUE DATE / TIME: 202105091624
ISSUE DATE / TIME: 202105111225
Unit Type and Rh: 5100
Unit Type and Rh: 5100
Unit Type and Rh: 5100

## 2019-06-10 LAB — TYPE AND SCREEN
ABO/RH(D): O POS
Antibody Screen: POSITIVE
Donor AG Type: NEGATIVE
Donor AG Type: NEGATIVE
Donor AG Type: NEGATIVE
Unit division: 0
Unit division: 0
Unit division: 0

## 2019-07-05 ENCOUNTER — Telehealth (HOSPITAL_BASED_OUTPATIENT_CLINIC_OR_DEPARTMENT_OTHER): Payer: Medicaid Other | Admitting: Obstetrics and Gynecology

## 2019-07-05 DIAGNOSIS — Z3009 Encounter for other general counseling and advice on contraception: Secondary | ICD-10-CM

## 2019-07-05 MED ORDER — NORGESTIMATE-ETH ESTRADIOL 0.25-35 MG-MCG PO TABS
1.0000 | ORAL_TABLET | Freq: Every day | ORAL | 11 refills | Status: DC
Start: 2019-07-05 — End: 2022-02-25

## 2019-07-05 NOTE — Progress Notes (Signed)
I connected with@ on 07/05/19 at 10:00 AM EDT by: Mychart video and verified that I am speaking with the correct person using two identifiers.  Patient is located at home and provider is located at [employer] for [employer] at [place] .     The purpose of this virtual visit is to provide medical care while limiting exposure to the novel coronavirus. I discussed the limitations, risks, security and privacy concerns of performing an evaluation and management service by [facility] and the availability of in person appointments. I also discussed with the patient that there may be a patient responsible charge related to this service. By engaging in this virtual visit, you consent to the provision of healthcare.  Additionally, you authorize for your insurance to be billed for the services provided during this visit.  The patient expressed understanding and agreed to proceed.  Obstetrics/Postpartum Visit  Appointment Date: 07/05/2019  OBGYN Clinic: [facility]  Primary Care Provider: Patient, No Pcp Per  Chief Complaint:  Chief Complaint  Patient presents with  . Postpartum Care    History of Present Illness: Kendra Fields is a 29 y.o. African-American [phone] (Patient's last menstrual period was 09/17/2018.), seen for the above chief complaint. Her past medical history is significant for h/o IUFD, h/o PEC.   She is s/p SVD on 06/03/19 at 37 weeks; she was discharged to home on PPD#2. Pregnancy complicated by h/o PEC, h/o IUFD.  Complains of some hip pain.  Vaginal bleeding or discharge: No  Breast or formula feeding: bottle Intercourse: No  Contraception: desires patch PP depression s/s: No  Any bowel or bladder issues: No  Pap smear: no abnormalities (date: 1/21)  Review of Systems: Positive for n/a.   Her 12 point review of systems is negative or as noted in the History of Present Illness.  Patient Active Problem List   Diagnosis Date Noted  . NSVD (normal spontaneous vaginal delivery)    . History of IUFD 02/09/2019  . History of pre-eclampsia 02/01/2019  . History of preterm delivery, currently pregnant 02/01/2019  . Supervision of high risk pregnancy, antepartum 02/01/2019    Medications [name] had no medications administered during this visit. Current Outpatient Medications  Medication Sig Dispense Refill  . Prenat w/o A-FeCbn-Meth-FA-DHA (PRENATE MINI) 29-0.6-0.4-350 MG CAPS Take 1 capsule by mouth daily before breakfast. 90 capsule 3  . acetaminophen (TYLENOL) 325 MG tablet Take 325 mg by mouth as needed for moderate pain or headache.     . Blood Pressure Monitoring (BLOOD PRESSURE KIT) DEVI 1 kit by Does not apply route once a week. Check BP regularly and record readings in the Babyscripts App. Large Cuff. DX O90.0 1 each 0  . diphenhydramine-acetaminophen (TYLENOL PM) 25-500 MG TABS tablet Take 1 tablet by mouth at bedtime as needed (sleep aid).    Marland Kitchen ibuprofen (ADVIL) 600 MG tablet Take 1 tablet (600 mg total) by mouth every 6 (six) hours. (Patient not taking: Reported on 07/05/2019) 30 tablet 0   No current facility-administered medications for this visit.    Allergies Penicillins  Physical Exam:  BP 109/79   Pulse 88   LMP 09/17/2018   Breastfeeding No  There is no height or weight on file to calculate BMI. General appearance: Well nourished, well developed female in no acute distress.  Cardiovascular: regular rate and rhythm Respiratory: normal respiratory effort  PP Depression Screening:   Edinburgh Postnatal Depression Scale - 07/05/19 0943      [name] Postnatal  Depression Scale:  In the Past 7 Days   I have been able to laugh and see the funny side of things.  0    I have looked forward with enjoyment to things.  0    I have blamed myself unnecessarily when things went wrong.  0    I have been anxious or worried for no good reason.  0    I have felt scared or panicky for no good reason.  0    Things have been getting on top of me.  0     I have been so unhappy that I have had difficulty sleeping.  0    I have felt sad or miserable.  0    I have been so unhappy that I have been crying.  0    The thought of harming myself has occurred to me.  0    Edinburgh Postnatal Depression Scale Total  0       Assessment: Patient is a 29 y.o. [id number] who is 4 weeks post partum from a SVD. She is doing well.   Plan:   1. Postpartum state Doing well  2. Encounter for counseling regarding contraception Reviewed risks of estrogen containing contraception, increased risks of VTE with patch, with strong family history of blood clots, she desires to start OCPs   Essential components of care per ACOG recommendations:  1.  Mood and well being: Patient with negative depression screening today. Reviewed local resources for support.  - Patient does not use tobacco.  - hx of drug use? No    2. Infant care and feeding:  -Patient currently breastmilk feeding? No  -Social determinants of health (SDOH) reviewed in EPIC. No concerns  3. Sexuality, contraception and birth spacing - Patient does not want a pregnancy in the next year.  Desired family size is 3 children.  - Reviewed forms of contraception in tiered fashion. Patient desired oral contraceptives (estrogen/progesterone) today.  Sprintec sent to pharmacy  4. Sleep and fatigue -Encouraged family/partner/community support of 4 hrs of uninterrupted sleep to help with mood and fatigue  5. Physical Recovery  - Discussed patients delivery and complications - Patient had a 2nd degree laceration, perineal healing reviewed. Patient expressed understanding - Patient has urinary incontinence? No  - Patient is safe to resume physical and sexual activity  6.  Health Maintenance - Last pap smear done 01/2019 and was normal with negative HPV.   RTC for annual   [name], M.D. Attending [facility] for [employer])

## 2020-07-09 ENCOUNTER — Ambulatory Visit (HOSPITAL_COMMUNITY)
Admission: EM | Admit: 2020-07-09 | Discharge: 2020-07-09 | Disposition: A | Discharge status: Home / Self Care | Payer: Medicaid Other | Attending: Family Medicine | Admitting: Family Medicine

## 2020-07-09 ENCOUNTER — Encounter (HOSPITAL_COMMUNITY): Payer: Self-pay

## 2020-07-09 ENCOUNTER — Other Ambulatory Visit: Payer: Self-pay

## 2020-07-09 DIAGNOSIS — K047 Periapical abscess without sinus: Secondary | ICD-10-CM | POA: Diagnosis not present

## 2020-07-09 MED ORDER — HYDROCODONE-ACETAMINOPHEN 5-325 MG PO TABS: 1.0000 | ORAL_TABLET | Freq: Three times a day (TID) | ORAL | 0 refills | Status: DC | PRN

## 2020-07-09 MED ORDER — AMOXICILLIN-POT CLAVULANATE 875-125 MG PO TABS: 1.0000 | ORAL_TABLET | Freq: Two times a day (BID) | ORAL | 0 refills | Status: AC

## 2020-07-09 MED ORDER — IBUPROFEN 600 MG PO TABS
600.0000 mg | ORAL_TABLET | Freq: Four times a day (QID) | ORAL | 0 refills | Status: DC | PRN
Start: 2020-07-09 — End: 2022-02-25

## 2020-07-09 NOTE — ED Triage Notes (Signed)
Pt presents with worsening dental abscess x 1 month.  L lower jaw swollen.  Pt does not have a dentist at this time.  Ibuprofen not helping.

## 2020-07-09 NOTE — ED Provider Notes (Signed)
Kendra Fields    CSN: [id number] Arrival date & time: 07/09/20  1440      History   Chief Complaint Chief Complaint  Patient presents with   Dental Pain    HPI Kendra Fields is a 30 y.o. female presenting with dental pain.  She is having swelling on the left lower mandible.  Has been ongoing for a few days.  Pain is severe in nature.  No shortness of breath.  She does have some pain in the neck.  Does have a dentist but has yet to been scheduled.Kendra Fields   HPI  Past Medical History:  Diagnosis Date   Chlamydia December 2016   Pregnancy induced hypertension    Preterm labor    Trichomonas vaginitis December 2016    Patient Active Problem List   Diagnosis Date Noted   NSVD (normal spontaneous vaginal delivery)    History of IUFD 02/09/2019   History of pre-eclampsia 02/01/2019   History of preterm delivery, currently pregnant 02/01/2019   Supervision of high risk pregnancy, antepartum 02/01/2019    Past Surgical History:  Procedure Laterality Date   NO PAST SURGERIES      OB History     Gravida  5   Para  4   Term  1   Preterm  3   AB  1   Living  3      SAB  0   IAB  0   Ectopic  0   Multiple  1   Live Births  3            Home Medications    Prior to Admission medications   Medication Sig Start Date End Date Taking? Authorizing Provider  amoxicillin-clavulanate (AUGMENTIN) 875-125 MG tablet Take 1 tablet by mouth 2 (two) times daily for 10 days. 07/09/20 07/19/20 Yes Rosemarie Ax, MD  Blood Pressure Monitoring (BLOOD PRESSURE KIT) DEVI 1 kit by Does not apply route once a week. Check BP regularly and record readings in the Babyscripts App. Large Cuff. DX O90.0 12/30/18  Yes Shelly Bombard, MD  HYDROcodone-acetaminophen (NORCO/VICODIN) 5-325 MG tablet Take 1 tablet by mouth every 8 (eight) hours as needed for moderate pain. 07/09/20  Yes Rosemarie Ax, MD  ibuprofen (ADVIL) 600 MG tablet Take 1 tablet (600 mg total) by  mouth every 6 (six) hours as needed. 07/09/20  Yes Rosemarie Ax, MD  norgestimate-ethinyl estradiol (ORTHO-CYCLEN) 0.25-35 MG-MCG tablet Take 1 tablet by mouth daily. 07/05/19   Sloan Leiter, MD    Family History Family History  Problem Relation Age of Onset   Alcohol abuse Mother    Asthma Brother    Stroke Maternal Uncle    Cancer Maternal Grandmother        brain   Diabetes Maternal Grandmother    Diabetes Paternal Grandmother    Hypertension Paternal Grandmother     Social History Social History   Tobacco Use   Smoking status: Former    Pack years: 0.00    Types: Cigarettes   Smokeless tobacco: Never  Vaping Use   Vaping Use: Never used  Substance Use Topics   Alcohol use: Yes    Comment: occassionally   Drug use: No     Allergies   Penicillins   Review of Systems Review of Systems See HPI  Physical Exam Triage Vital Signs ED Triage Vitals  Enc Vitals Group     BP 07/09/20 1621 (!) 126/95  Pulse Rate 07/09/20 1621 73     Resp 07/09/20 1621 18     Temp 07/09/20 1621 98.4 F (36.9 C)     Temp Source 07/09/20 1621 Oral     SpO2 07/09/20 1621 100 %     Weight --      Height --      Head Circumference --      Peak Flow --      Pain Score 07/09/20 1622 8     Pain Loc --      Pain Edu? --      Excl. in [place]? --    No data found.  Updated Vital Signs BP (!) 126/95 (BP Location: Right Arm)   Pulse 73   Temp 98.4 F (36.9 C) (Oral)   Resp 18   LMP 06/30/2020 (Approximate)   SpO2 100%   Breastfeeding No   Visual Acuity Right Eye Distance:   Left Eye Distance:   Bilateral Distance:    Right Eye Near:   Left Eye Near:    Bilateral Near:     Physical Exam Gen: NAD, alert, cooperative with exam, well-appearing ENT: Enlargement and tenderness in the left lower mandible, no expression on palpation on the inside of the mouth.   UC Treatments / Results  Labs (all labs ordered are listed, but only abnormal results are displayed) Labs  Reviewed - No data to display  EKG   Radiology No results found.  Procedures Procedures (including critical care time)  Medications Ordered in UC Medications - No data to display  Initial Impression / Assessment and Plan / UC Course  I have reviewed the triage vital signs and the nursing notes.  Pertinent labs & imaging results that were available during my care of the patient were reviewed by me and considered in my medical decision making (see chart for details).     [name] is a 30 year old female is presenting with a dental abscess.  Provided Augmentin, ibuprofen and Norco.  Counseled and provided resources on getting seen by dentist.  Given indications on immediate care and follow-up.  Final Clinical Impressions(s) / UC Diagnoses   Final diagnoses:  Dental abscess     Discharge Instructions      Please take the antibiotics and medicines for pain.  Please be seen by a dentist while taking the antibiotics.  Please be seen in the emergency department if your symptoms worsen.   Dental list          updated 1.22.15 These dentists all accept Medicaid.  The list is for your convenience in choosing your child's dentist. Estos dentistas aceptan Medicaid.  La lista es para su [place] y es una cortesa.     [facility]     [phone] [place] [address] Se habla espaol From 64 to 30 years old Parent may go with child Anette Riedel DDS     [phone] [address] [place]  [address] Se habla espaol From 26 to 76 years old Parent may NOT go with child  Rolene Arbour DMD    [phone] [place] [address] Se habla espaol [place] spoken From 41 years old Parent may go with child [facility]     [phone] [place]. [place] [address] Se habla espaol From 25 to 40 years old Parent may NOT go with child  Marcelo Baldy DDS     [facility]  [address]  [place] [address] No se habla espaol From teeth coming in Parent may go with child  [facility]     [phone] [address] [place]. [place] [address] Requires certification. Call for information. Requiere certificacin. Llame para informacin. Algunos dias se habla espaol  From birth to 22 years Parent possibly goes with child  Kandice Hams DDS     [place]  [address] [place] [address] Se habla espaol From 18 months to 18 years  Parent may go with child  J. [place] DDS    [place] DDS [address] [place] [address] Se habla espaol From 3 year old Parent may go with child  Shelton Silvas DDS    [phone] [place] [address] Se habla espaol  From 47 months old Parent may go with child Ivory Broad DDS    [phone] [address] [place] [address] Se habla espaol From 52 to 57 years old Parent may go with child  [facility]    [phone] [address] [place] [address] No se habla espaol From birth Parent may not go with child         ED Prescriptions     Medication Sig Dispense Auth. Provider   HYDROcodone-acetaminophen (NORCO/VICODIN) 5-325 MG tablet Take 1 tablet by mouth every 8 (eight) hours as needed for moderate pain. 15 tablet Rosemarie Ax, MD   amoxicillin-clavulanate (AUGMENTIN) 875-125 MG tablet Take 1 tablet by mouth 2 (two) times daily for 10 days. 20 tablet Rosemarie Ax, MD   ibuprofen (ADVIL) 600 MG tablet Take 1 tablet (600 mg total) by mouth every 6 (six) hours as needed. 30 tablet Rosemarie Ax, MD      I have reviewed the PDMP during this encounter.   Rosemarie Ax, MD 07/09/20 Johnnye Lana

## 2020-07-09 NOTE — Discharge Instructions (Addendum)
Please take the antibiotics and medicines for pain.  Please be seen by a dentist while taking the antibiotics.  Please be seen in the emergency department if your symptoms worsen.   Dental list          updated 1.22.15 These dentists all accept Medicaid.  The list is for your convenience in choosing your child's dentist. Estos dentistas aceptan Medicaid.  La lista es para su [place] y es una cortesa.     [facility]     [phone] [address]  [address] [place] [address] Se habla espaol From 47 to 11 years old Parent may go with child Vinson Moselle DDS     [phone] [address] [place]  [address] Se habla espaol From 68 to 62 years old Parent may NOT go with child  Marolyn Hammock DMD    [phone] [address] [place] [address] Se habla espaol [place] spoken From 65 years old Parent may go with child [facility]     [phone] [address] [place]. [place] [address] Se habla espaol From 46 to 51 years old Parent may NOT go with child  Winfield Rast DDS     [phone] [facility]      [address]  [place] [address] No se habla espaol From teeth coming in Parent may go with child  [facility].     [phone] [address] [place]. [place] [address] Requires certification. Call for information. Requiere certificacin. Llame para informacin. Algunos dias se habla espaol  From birth to 20 years Parent possibly goes with child  Bradd Canary DDS     [phone] [address] [place]  [address] [place] [address] Se habla espaol From 18 months to 18 years  Parent may go with child  J. [place] DDS    [phone] Garlon Hatchet DDS [address]  [place] [address] Se habla espaol From 30 year old Parent may go with child  Melynda Ripple DDS    [phone] [address] [place] [address] Se habla espaol  From 79 months  old Parent may go with child Dorian Pod DDS    [phone] [address] [place] [address] Se habla espaol From 77 to 76 years old Parent may go with child  [facility]    [phone] [address] [place] [address] No se habla espaol From birth Parent may not go with child

## 2020-09-24 ENCOUNTER — Other Ambulatory Visit: Payer: Self-pay

## 2020-09-24 ENCOUNTER — Encounter (HOSPITAL_COMMUNITY): Payer: Self-pay

## 2020-09-24 ENCOUNTER — Emergency Department (HOSPITAL_COMMUNITY)
Admission: EM | Admit: 2020-09-24 | Discharge: 2020-09-25 | Disposition: A | Discharge status: Home / Self Care | Payer: Medicaid Other | Attending: Emergency Medicine | Admitting: Emergency Medicine

## 2020-09-24 DIAGNOSIS — H00014 Hordeolum externum left upper eyelid: Secondary | ICD-10-CM | POA: Diagnosis not present

## 2020-09-24 DIAGNOSIS — Z87891 Personal history of nicotine dependence: Secondary | ICD-10-CM | POA: Diagnosis not present

## 2020-09-24 DIAGNOSIS — H5789 Other specified disorders of eye and adnexa: Secondary | ICD-10-CM | POA: Diagnosis present

## 2020-09-24 NOTE — ED Triage Notes (Signed)
Pt has a stye to her left eye x 1 week. Pt complains of pain, swelling, and redness.

## 2020-09-25 MED ORDER — ERYTHROMYCIN 5 MG/GM OP OINT: TOPICAL_OINTMENT | Freq: Once | OPHTHALMIC | Status: DC

## 2020-09-25 MED ORDER — ERYTHROMYCIN 5 MG/GM OP OINT
TOPICAL_OINTMENT | Freq: Four times a day (QID) | OPHTHALMIC | Status: DC
  Administered 2020-09-25: 1 via OPHTHALMIC
  Filled 2020-09-25: qty 3.5

## 2020-09-25 NOTE — ED Provider Notes (Signed)
[facility] DEPT Provider Note   CSN: [id number] Arrival date & time: 09/24/20  2006     History Chief Complaint  Patient presents with   Eye Problem    MAO LOCKNER is a 30 y.o. female.  Patient to ED with painful swelling of the left upper eye lid that started about 1 week ago. Marland Kitchen No injury. No visual change. She reports that over the last 1-2 days, eye lashes on left have been matted shut in the mornings.  The history is provided by the patient. No language interpreter was used.  Eye Problem     Past Medical History:  Diagnosis Date   Chlamydia December 2016   Pregnancy induced hypertension    Preterm labor    Trichomonas vaginitis December 2016    Patient Active Problem List   Diagnosis Date Noted   NSVD (normal spontaneous vaginal delivery)    History of IUFD 02/09/2019   History of pre-eclampsia 02/01/2019   History of preterm delivery, currently pregnant 02/01/2019   Supervision of high risk pregnancy, antepartum 02/01/2019    Past Surgical History:  Procedure Laterality Date   NO PAST SURGERIES       OB History     Gravida  5   Para  4   Term  1   Preterm  3   AB  1   Living  3      SAB  0   IAB  0   Ectopic  0   Multiple  1   Live Births  3           Family History  Problem Relation Age of Onset   Alcohol abuse Mother    Asthma Brother    Stroke Maternal Uncle    Cancer Maternal Grandmother        brain   Diabetes Maternal Grandmother    Diabetes Paternal Grandmother    Hypertension Paternal Grandmother     Social History   Tobacco Use   Smoking status: Former    Types: Cigarettes   Smokeless tobacco: Never  Vaping Use   Vaping Use: Never used  Substance Use Topics   Alcohol use: Yes    Comment: occassionally   Drug use: No    Home Medications Prior to Admission medications   Medication Sig Start Date End Date Taking? Authorizing Provider  Blood Pressure Monitoring (BLOOD  PRESSURE KIT) DEVI 1 kit by Does not apply route once a week. Check BP regularly and record readings in the Babyscripts App. Large Cuff. DX O90.0 12/30/18   Shelly Bombard, MD  HYDROcodone-acetaminophen (NORCO/VICODIN) 5-325 MG tablet Take 1 tablet by mouth every 8 (eight) hours as needed for moderate pain. 07/09/20   Rosemarie Ax, MD  ibuprofen (ADVIL) 600 MG tablet Take 1 tablet (600 mg total) by mouth every 6 (six) hours as needed. 07/09/20   Rosemarie Ax, MD  norgestimate-ethinyl estradiol (ORTHO-CYCLEN) 0.25-35 MG-MCG tablet Take 1 tablet by mouth daily. 07/05/19   Sloan Leiter, MD    Allergies    Penicillins  Review of Systems   Review of Systems  Constitutional:  Negative for fever.  HENT: Negative.    Eyes:  Positive for pain. Negative for visual disturbance.   Physical Exam Updated Vital Signs BP (!) 122/98 (BP Location: Left Arm)   Pulse 65   Temp 98.2 F (36.8 C) (Oral)   Resp 17   Ht 5' 4"  (1.626 m)   Wt  59 kg   SpO2 99%   BMI 22.31 kg/m   Physical Exam Vitals and nursing note reviewed.  Constitutional:      General: She is not in acute distress.    Appearance: She is well-developed. She is not ill-appearing.  Eyes:     Comments: Left upper eye lid has a hard nodular swelling that is erythematous to the center of the lid. C/w stye. Appears to be external. No drainage. Sclera and cornea are clear.   Pulmonary:     Effort: Pulmonary effort is normal.  Musculoskeletal:        General: Normal range of motion.     Cervical back: Normal range of motion.  Skin:    General: Skin is warm and dry.  Neurological:     Mental Status: She is alert and oriented to person, place, and time.    ED Results / Procedures / Treatments   Labs (all labs ordered are listed, but only abnormal results are displayed) Labs Reviewed - No data to display  EKG None  Radiology No results found.  Procedures Procedures   Medications Ordered in ED Medications   erythromycin ophthalmic ointment (has no administration in time range)    ED Course  I have reviewed the triage vital signs and the nursing notes.  Pertinent labs & imaging results that were available during my care of the patient were reviewed by me and considered in my medical decision making (see chart for details).    MDM Rules/Calculators/[name]                           Patient to ED with stye to upper lid x 1 week. She reports matting of eye lids. Will provide erythromycin ointment, warm compresses. Refer to ophtho is no improvement in the next week.   Final Clinical Impression(s) / ED Diagnoses Final diagnoses:  Hordeolum externum of left upper eyelid    Rx / DC Orders ED Discharge Orders     None        Dennie Bible 09/28/20 2220    Valarie Merino, MD 10/03/20 1446

## 2020-09-25 NOTE — Discharge Instructions (Addendum)
Continue warm compresses to the stye on the left eye. Use the eye ointment for comfort and for any infectious component.   Follow up with ophthalmology as discussed

## 2021-06-06 ENCOUNTER — Encounter (HOSPITAL_COMMUNITY): Payer: Self-pay | Admitting: Emergency Medicine

## 2021-06-06 ENCOUNTER — Ambulatory Visit (HOSPITAL_COMMUNITY)
Admission: EM | Admit: 2021-06-06 | Discharge: 2021-06-06 | Disposition: A | Discharge status: Home / Self Care | Payer: Medicaid Other | Attending: Internal Medicine | Admitting: Internal Medicine

## 2021-06-06 DIAGNOSIS — Z113 Encounter for screening for infections with a predominantly sexual mode of transmission: Secondary | ICD-10-CM | POA: Insufficient documentation

## 2021-06-06 DIAGNOSIS — R1030 Lower abdominal pain, unspecified: Secondary | ICD-10-CM | POA: Diagnosis present

## 2021-06-06 DIAGNOSIS — Z3202 Encounter for pregnancy test, result negative: Secondary | ICD-10-CM | POA: Insufficient documentation

## 2021-06-06 DIAGNOSIS — N3001 Acute cystitis with hematuria: Secondary | ICD-10-CM | POA: Insufficient documentation

## 2021-06-06 LAB — POC URINE PREG, ED: Preg Test, Ur: NEGATIVE

## 2021-06-06 LAB — POCT URINALYSIS DIPSTICK, ED / UC
Bilirubin Urine: NEGATIVE
Glucose, UA: NEGATIVE mg/dL
Ketones, ur: NEGATIVE mg/dL
Nitrite: NEGATIVE
Protein, ur: 100 mg/dL — AB
Specific Gravity, Urine: 1.02 (ref 1.005–1.030)
Urobilinogen, UA: 0.2 mg/dL (ref 0.0–1.0)
pH: 6.5 (ref 5.0–8.0)

## 2021-06-06 MED ORDER — CEPHALEXIN 500 MG PO CAPS: 500.0000 mg | ORAL_CAPSULE | Freq: Four times a day (QID) | ORAL | 0 refills | Status: DC

## 2021-06-06 NOTE — ED Triage Notes (Addendum)
Pt is present today with concerns of abdominal pain that radiates to the her lower back. Pt states that she also states that she noticed light spotting and diarrhea yesterday Pt sx started Monday  ?

## 2021-06-06 NOTE — ED Provider Notes (Signed)
?[facility] ? ? ? ?CSN: [id number] ?Arrival date & time: 06/06/21  1308 ? ? ?  ? ?History   ?Chief Complaint ?Chief Complaint  ?Patient presents with  ? Abdominal Pain  ? Back Pain  ? ? ?HPI ?Kendra Fields is a 31 y.o. female.  ? ?Patient presents with abdominal pain that radiates around to right lower back that has been present for approximately 4 days.  Pain is present in the lower abdomen and is described as a sharp and cramping pain.  It has become more constant.  Patient rates pain 8/10 to 10/10 on pain scale.  Denies nausea, vomiting, constipation but does report some intermittent diarrhea.  Denies blood in stool. Denies that she has upper respiratory symptoms or sick contacts.  Denies any fever, body aches, chills.  Patient does report an episode of vaginal spotting when symptoms first started.  Her last menstrual cycle was at the end of last month, so the vaginal spotting was unusual for her.  Denies any obvious urinary burning, urinary frequency, hematuria but does report that it feels "uncomfortable" at the end of her urine stream.  Denies any associated vaginal discharge or any known exposure to STD but has had unprotected sexual intercourse recently. ? ? ?Abdominal Pain ?Back Pain ? ?Past Medical History:  ?Diagnosis Date  ? Chlamydia December 2016  ? Pregnancy induced hypertension   ? Preterm labor   ? Trichomonas vaginitis December 2016  ? ? ?Patient Active Problem List  ? Diagnosis Date Noted  ? NSVD (normal spontaneous vaginal delivery)   ? History of IUFD 02/09/2019  ? History of pre-eclampsia 02/01/2019  ? History of preterm delivery, currently pregnant 02/01/2019  ? Supervision of high risk pregnancy, antepartum 02/01/2019  ? ? ?Past Surgical History:  ?Procedure Laterality Date  ? NO PAST SURGERIES    ? ? ?OB History   ? ? Gravida  ?5  ? Para  ?4  ? Term  ?1  ? Preterm  ?3  ? AB  ?1  ? Living  ?3  ?  ? ? SAB  ?0  ? IAB  ?0  ? Ectopic  ?0  ? Multiple  ?1  ? Live Births  ?3  ?   ?  ?   ? ? ? ?Home Medications   ? ?Prior to Admission medications   ?Medication Sig Start Date End Date Taking? Authorizing Provider  ?cephALEXin (KEFLEX) 500 MG capsule Take 1 capsule (500 mg total) by mouth 4 (four) times daily. 06/06/21  Yes Teodora Medici, FNP  ?Blood Pressure Monitoring (BLOOD PRESSURE KIT) DEVI 1 kit by Does not apply route once a week. Check BP regularly and record readings in the Babyscripts App. Large Cuff. DX O90.0 12/30/18   Shelly Bombard, MD  ?HYDROcodone-acetaminophen (NORCO/VICODIN) 5-325 MG tablet Take 1 tablet by mouth every 8 (eight) hours as needed for moderate pain. 07/09/20   Rosemarie Ax, MD  ?ibuprofen (ADVIL) 600 MG tablet Take 1 tablet (600 mg total) by mouth every 6 (six) hours as needed. 07/09/20   Rosemarie Ax, MD  ?norgestimate-ethinyl estradiol (ORTHO-CYCLEN) 0.25-35 MG-MCG tablet Take 1 tablet by mouth daily. 07/05/19   Sloan Leiter, MD  ? ? ?Family History ?Family History  ?Problem Relation Age of Onset  ? Alcohol abuse Mother   ? Asthma Brother   ? Stroke Maternal Uncle   ? Cancer Maternal Grandmother   ?     brain  ? Diabetes Maternal Grandmother   ?  Diabetes Paternal Grandmother   ? Hypertension Paternal Grandmother   ? ? ?Social History ?Social History  ? ?Tobacco Use  ? Smoking status: Former  ?  Types: Cigarettes  ? Smokeless tobacco: Never  ?Vaping Use  ? Vaping Use: Never used  ?Substance Use Topics  ? Alcohol use: Yes  ?  Comment: occassionally  ? Drug use: No  ? ? ? ?Allergies   ?Penicillins ? ? ?Review of Systems ?Review of Systems ?Per HPI ? ?Physical Exam ?Triage Vital Signs ?ED Triage Vitals  ?Enc Vitals Group  ?   BP 06/06/21 1050 107/86  ?   Pulse Rate 06/06/21 1050 74  ?   Resp 06/06/21 1050 17  ?   Temp 06/06/21 1050 98.5 ?F (36.9 ?C)  ?   Temp src --   ?   SpO2 06/06/21 1050 100 %  ?   Weight --   ?   Height --   ?   Head Circumference --   ?   Peak Flow --   ?   Pain Score 06/06/21 1048 8  ?   Pain Loc --   ?   Pain Edu? --   ?   Excl. in [place]?  --   ? ?No data found. ? ?Updated Vital Signs ?BP 107/86   Pulse 74   Temp 98.5 ?F (36.9 ?C)   Resp 17   LMP 05/13/2021 (Approximate)   SpO2 100%  ? ?Visual Acuity ?Right Eye Distance:   ?Left Eye Distance:   ?Bilateral Distance:   ? ?Right Eye Near:   ?Left Eye Near:    ?Bilateral Near:    ? ?Physical Exam ?Constitutional:   ?   General: She is not in acute distress. ?   Appearance: Normal appearance. She is not toxic-appearing or diaphoretic.  ?HENT:  ?   Head: Normocephalic and atraumatic.  ?Eyes:  ?   Extraocular Movements: Extraocular movements intact.  ?   Conjunctiva/sclera: Conjunctivae normal.  ?Cardiovascular:  ?   Rate and Rhythm: Normal rate and regular rhythm.  ?   Pulses: Normal pulses.  ?   Heart sounds: Normal heart sounds.  ?Pulmonary:  ?   Effort: Pulmonary effort is normal. No respiratory distress.  ?   Breath sounds: Normal breath sounds.  ?Abdominal:  ?   General: Abdomen is flat. Bowel sounds are normal.  ?   Palpations: Abdomen is soft.  ?   Tenderness: There is abdominal tenderness in the right lower quadrant, suprapubic area and left lower quadrant.  ?Genitourinary: ?   Comments: Deferred with shared decision making.  Self swab performed. ?Neurological:  ?   General: No focal deficit present.  ?   Mental Status: She is alert and oriented to person, place, and time. Mental status is at baseline.  ?Psychiatric:     ?   Mood and Affect: Mood normal.     ?   Behavior: Behavior normal.     ?   Thought Content: Thought content normal.     ?   Judgment: Judgment normal.  ? ? ? ?UC Treatments / Results  ?Labs ?(all labs ordered are listed, but only abnormal results are displayed) ?Labs Reviewed  ?POCT URINALYSIS DIPSTICK, ED / UC - Abnormal; Notable for the following components:  ?    Result Value  ? Hgb urine dipstick LARGE (*)   ? Protein, ur 100 (*)   ? Leukocytes,Ua MODERATE (*)   ? All other components within normal limits  ?  URINE CULTURE  ?POC URINE PREG, ED  ?CERVICOVAGINAL ANCILLARY  ONLY  ? ? ?EKG ? ? ?Radiology ?No results found. ? ?Procedures ?Procedures (including critical care time) ? ?Medications Ordered in UC ?Medications - No data to display ? ?Initial Impression / Assessment and Plan / UC Course  ?I have reviewed the triage vital signs and the nursing notes. ? ?Pertinent labs & imaging results that were available during my care of the patient were reviewed by me and considered in my medical decision making (see chart for details). ? ?  ? ?Urine pregnancy test was negative.  Original plan was to send patient to the hospital given severity of abdominal pain that the patient was reporting as I do think that further imaging of the abdomen may be necessary.  Although, urinalysis was completed by nursing staff due to nursing protocol and showed moderate leukocytes.  This could be indicative of urinary tract infection or possible vaginal infection given vaginal spotting that was present a few days prior.  Patient was advised of all of these test results and that the first recommendation is to go to the hospital for further evaluation and management given limited resources in urgent care.  Patient declined to go to the hospital.  Risks associated with not going to hospital discussed with patient.  Patient voiced understanding.  Will opt to treat with cephalexin antibiotic given patient's report of possible dysuria as well as moderate leukocytes on exam.  Urine culture and cervicovaginal swab pending as well.  Will await these results for any further treatment at this time.  Patient was given strict ER precautions and advised to go to the hospital if symptoms persist or worsen.  Patient verbalized understanding and was agreeable with plan. ?Final Clinical Impressions(s) / UC Diagnoses  ? ?Final diagnoses:  ?Acute cystitis with hematuria  ?Urine pregnancy test negative  ?Screening examination for venereal disease  ?Lower abdominal pain  ? ? ? ?Discharge Instructions   ? ?  ?You are being treated  with antibiotic for possible UTI.  Urine culture and vaginal swab are pending.  We will call if they are positive and treat as appropriate.  Please refrain from sexual activity until test results and treatment

## 2021-06-06 NOTE — Discharge Instructions (Signed)
You are being treated with antibiotic for possible UTI.  Urine culture and vaginal swab are pending.  We will call if they are positive and treat as appropriate.  Please refrain from sexual activity until test results and treatment are complete.  Go the hospital if symptoms do not improve or if they worsen. ?

## 2021-06-08 LAB — URINE CULTURE: Culture: >=100000 — AB

## 2021-09-02 ENCOUNTER — Ambulatory Visit
Admission: RE | Admit: 2021-09-02 | Discharge: 2021-09-02 | Disposition: A | Discharge status: Home / Self Care | Payer: Medicaid Other | Source: Ambulatory Visit | Attending: Family Medicine | Admitting: Family Medicine

## 2021-09-02 ENCOUNTER — Other Ambulatory Visit: Payer: Self-pay | Admitting: Family Medicine

## 2021-09-02 DIAGNOSIS — M549 Dorsalgia, unspecified: Secondary | ICD-10-CM

## 2021-09-12 IMAGING — US US MFM FETAL BPP W/O NON-STRESS
1 series · 13 of 28 positions shown · non-contrast
Comparison: none

[Series 1: us mfm fetal bpp w/o non-stress · 58 acquisitions, 13 frames shown]
[im 3/58]
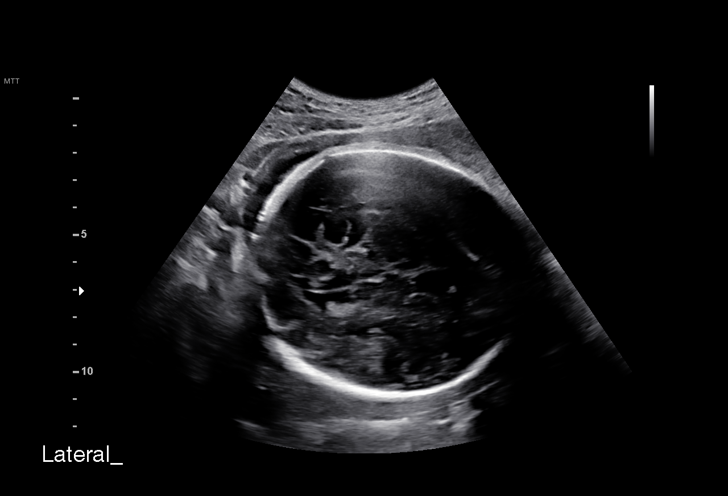
[im 7/58]
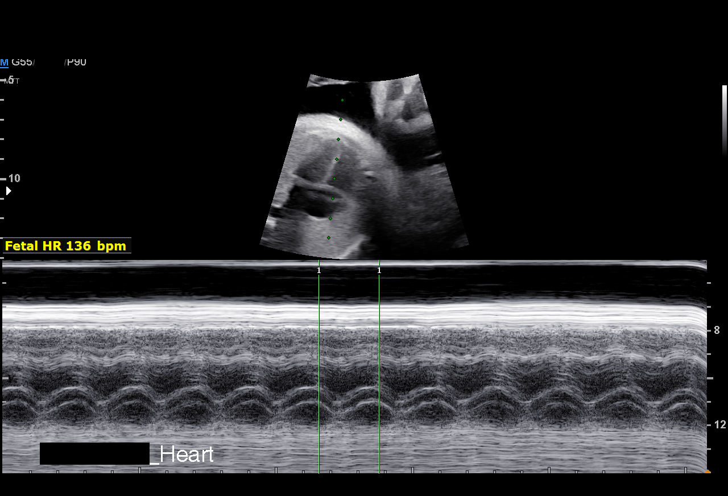
[im 11/58]
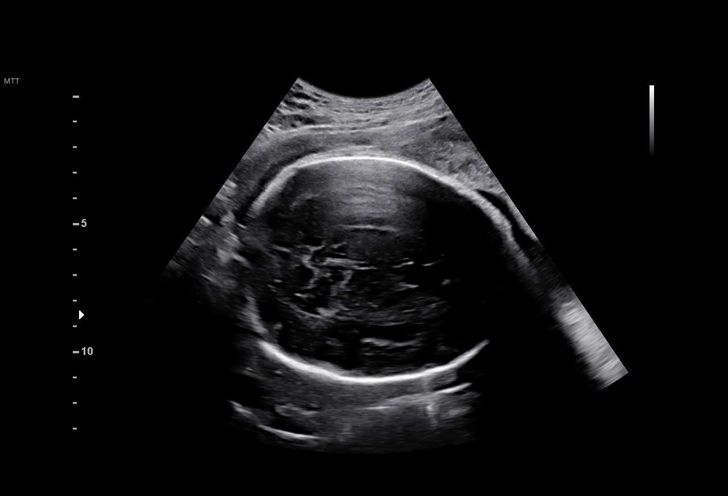
[im 15/58]
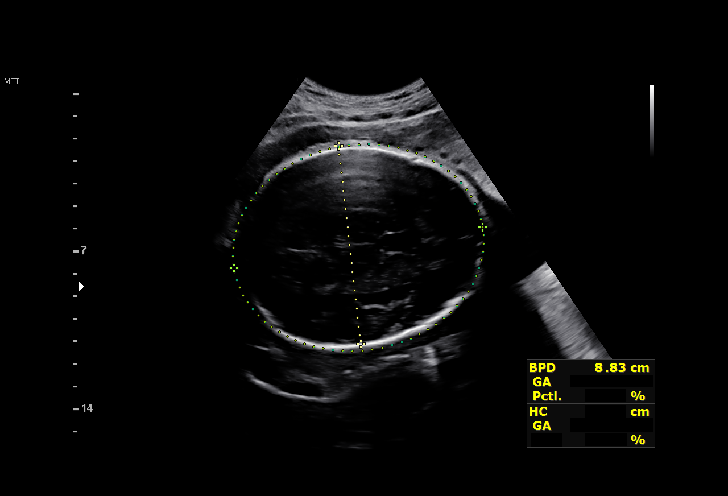
[im 20/58]
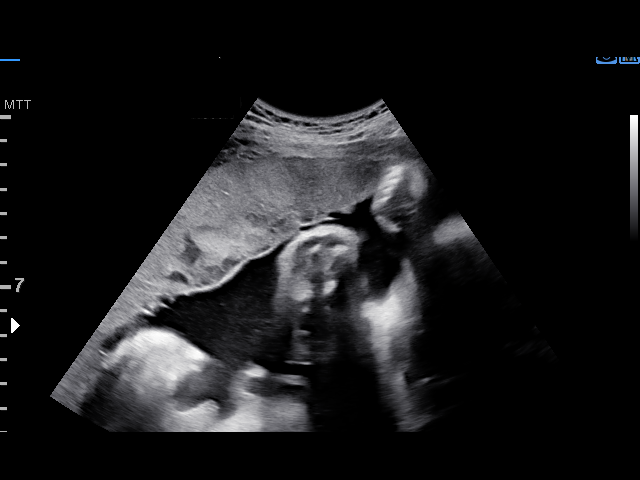
[im 24/58]
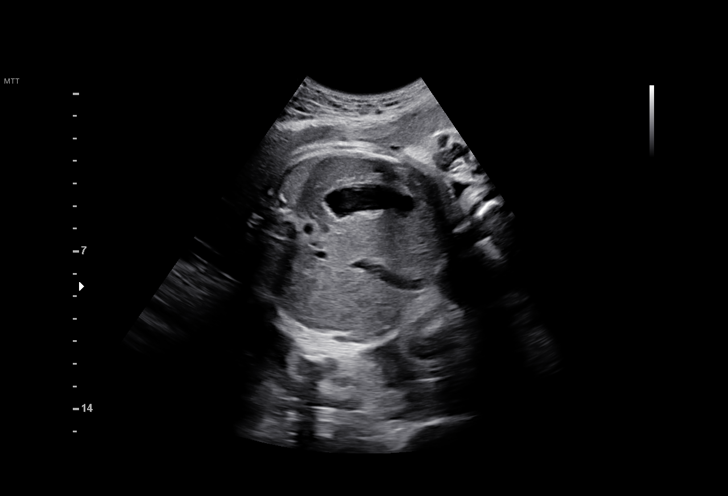
[im 30/58]
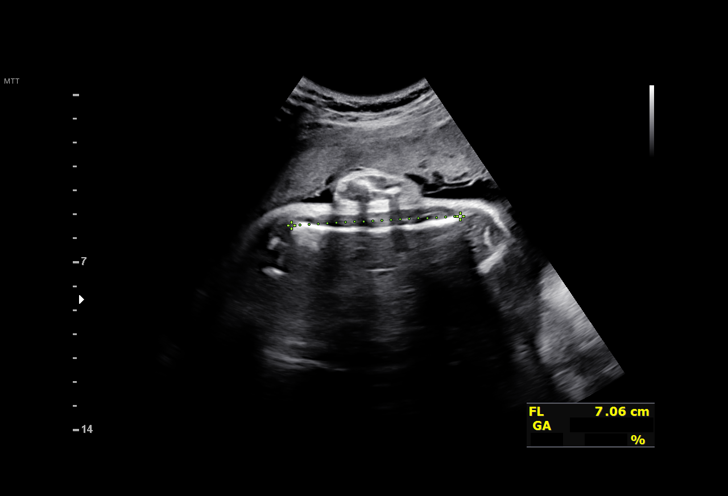
[im 34/58]
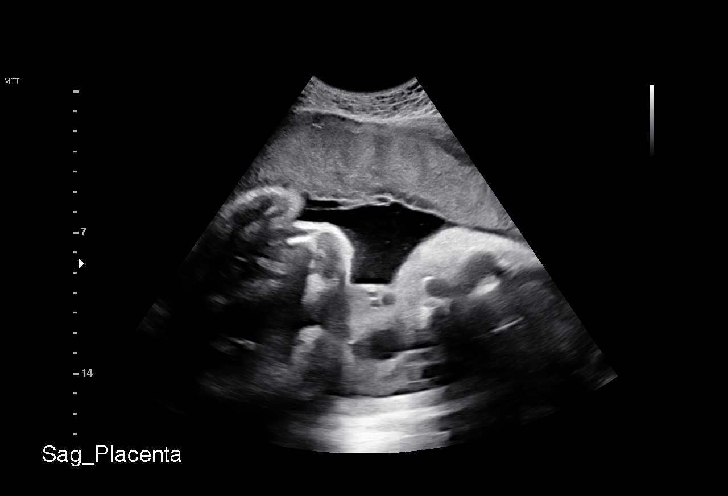
[im 39/58]
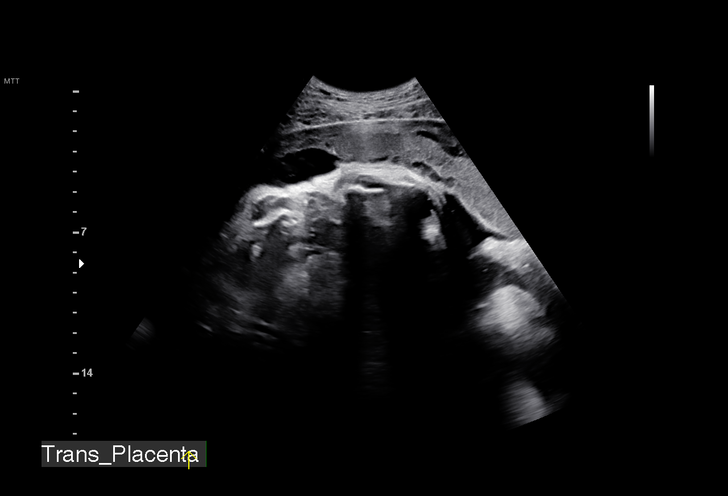
[im 43/58]
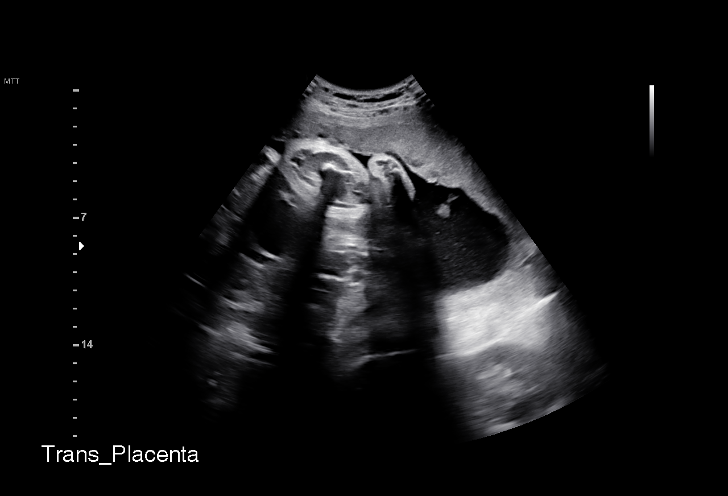
[im 47/58]
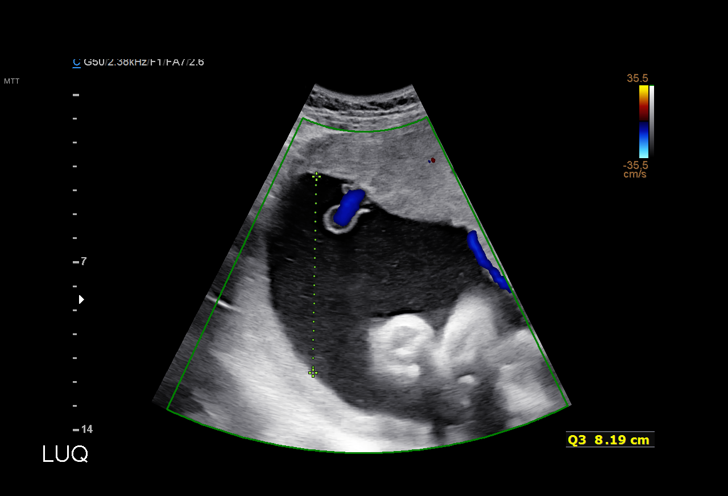
[im 51/58]
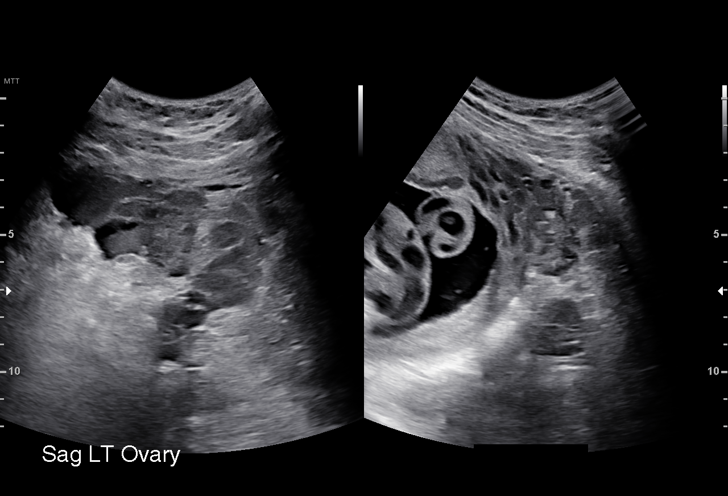
[im 55/58]
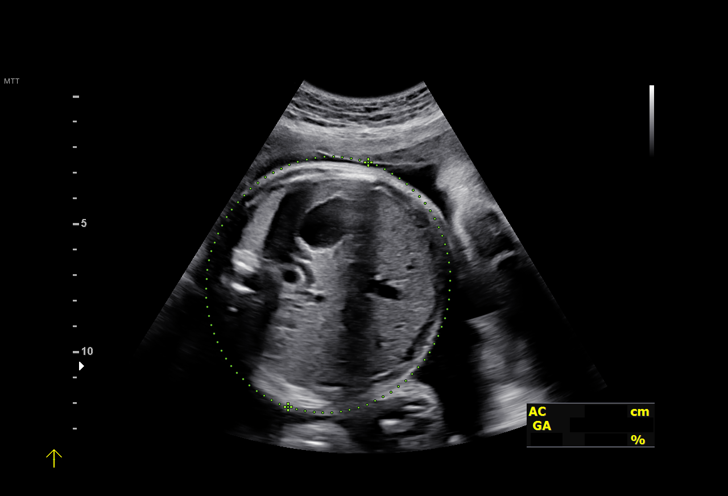

[13 of 28 positions shown; findings below may reference images not displayed]

[facility]                                   [HOSPITAL]

     STRESS                                            [facility]
                                                       BURKHALTER
 ----------------------------------------------------------------------

 ----------------------------------------------------------------------
Indications

  Poor obstetric history: Previous preterm
  delivery, antepartum x 3 (27 week twins, 2
  IUFD @ 30 weeks)
  34 weeks gestation of pregnancy
  Poor obstetric history: Previous IUFD
  (stillbirth) x 2 @ 30 weeks
  Poor obstetric history: Previous
  preeclampsia / eclampsia/gestational HTN
  Genetic carrier ([place])
  Low Risk NIPS
  Encounter for other antenatal screening
  follow-up
 ----------------------------------------------------------------------
Fetal Evaluation

 Num Of Fetuses:         1
 Fetal Heart Rate(bpm):  136
 Cardiac Activity:       Observed
 Presentation:           Cephalic
 Placenta:               Anterior
 P. Cord Insertion:      Previously Visualized

 Amniotic Fluid
 AFI FV:      Within normal limits

 AFI Sum(cm)     %Tile       Largest Pocket(cm)
 18.86           70
 RUQ(cm)       RLQ(cm)       LUQ(cm)        LLQ(cm)

Biophysical Evaluation

 Amniotic F.V:   Pocket => 2 cm             F. Tone:        Observed
 F. Movement:    Observed                   Score:          [DATE]
 F. Breathing:   Observed
Biometry

 BPD:      87.4  mm     G. Age:  35w 2d         68  %    CI:        76.11   %    70 - 86
                                                         FL/HC:      22.2   %    20.1 -
 HC:      317.5  mm     G. Age:  35w 5d         40  %    HC/AC:      1.04        0.93 -
 AC:      305.5  mm     G. Age:  34w 4d         50  %    FL/BPD:     80.5   %    71 - 87
 FL:       70.4  mm     G. Age:  36w 1d         77  %    FL/AC:      23.0   %    20 - 24
 CER:      48.9  mm     G. Age:  N/A          > 95  %

 LV:        9.5  mm

 Est. FW:    3284  gm    5 lb 12 oz      59  %
OB History

 Gravidity:    5         Term:   0        Prem:   3        SAB:   0
 TOP:          1       Ectopic:  0        Living: 2
Gestational Age

 LMP:           34w 5d        Date:  09/17/18                 EDD:   06/24/19
 U/S Today:     35w 3d                                        EDD:   06/19/19
 Best:          34w 5d     Det. By:  LMP  (09/17/18)          EDD:   06/24/19
Anatomy

 Cranium:               Appears normal         Aortic Arch:            Previously seen
 Cavum:                 Appears normal         Ductal Arch:            Previously seen
 Ventricles:            Appears normal         Diaphragm:              Appears normal
 Choroid Plexus:        Previously seen        Stomach:                Appears normal, left
                                                                       sided
 Cerebellum:            Appears normal         Abdomen:                Previously seen
 Posterior Fossa:       Previously seen        Abdominal Wall:         Previously seen
 Nuchal Fold:           Not applicable (>20    Cord Vessels:           Previously seen
                        wks GA)
 Face:                  Orbits and profile     Kidneys:                Appear normal
                        previously seen
 Lips:                  Previously seen        Bladder:                Appears normal
 Thoracic:              Previously seen        Spine:                  Previously seen
 Heart:                 Appears normal         Upper Extremities:      Previously seen
                        (4CH, axis, and
                        situs)
 RVOT:                  Previously seen        Lower Extremities:      Previously seen
 LVOT:                  Previously seen

 Other:  Heels, 5th digit and Nasal bone previously visualized. 3VV and 3VTV
         previously visualized.
Cervix Uterus Adnexa
 Cervix
 Not visualized (advanced GA >99wks)

 Uterus
 No abnormality visualized.

 Left Ovary
 Within normal limits. No adnexal mass visualized.

 Right Ovary
 Within normal limits. No adnexal mass visualized.

 Cul De Sac
 No free fluid seen.

 Adnexa
 No abnormality visualized.
Comments

 This patient was seen for a follow up growth scan due to 2
 prior fetal demises.  She denies any problems since her last
 exam.
 She was informed that the fetal growth and amniotic fluid
 level appears appropriate for her gestational age.
 A biophysical profile performed today was [DATE].
 Due to her prior history of two fetal demises, she already has
 a delivery scheduled at around 37 weeks on June 03, 2019.
 An NST was scheduled for her in 1 week.

## 2022-02-25 ENCOUNTER — Inpatient Hospital Stay (HOSPITAL_COMMUNITY)
Admission: AD | Admit: 2022-02-25 | Discharge: 2022-02-25 | Disposition: A | Discharge status: Home / Self Care | Payer: Medicaid Other | Attending: Obstetrics and Gynecology | Admitting: Obstetrics and Gynecology

## 2022-02-25 ENCOUNTER — Encounter (HOSPITAL_COMMUNITY): Payer: Self-pay | Admitting: Obstetrics and Gynecology

## 2022-02-25 ENCOUNTER — Inpatient Hospital Stay (HOSPITAL_COMMUNITY): Payer: Medicaid Other

## 2022-02-25 ENCOUNTER — Other Ambulatory Visit: Payer: Self-pay | Admitting: Family Medicine

## 2022-02-25 ENCOUNTER — Other Ambulatory Visit: Payer: Self-pay

## 2022-02-25 DIAGNOSIS — O26851 Spotting complicating pregnancy, first trimester: Secondary | ICD-10-CM | POA: Insufficient documentation

## 2022-02-25 DIAGNOSIS — O009 Unspecified ectopic pregnancy without intrauterine pregnancy: Secondary | ICD-10-CM | POA: Insufficient documentation

## 2022-02-25 DIAGNOSIS — Z3A01 Less than 8 weeks gestation of pregnancy: Secondary | ICD-10-CM | POA: Diagnosis not present

## 2022-02-25 DIAGNOSIS — O09291 Supervision of pregnancy with other poor reproductive or obstetric history, first trimester: Secondary | ICD-10-CM | POA: Insufficient documentation

## 2022-02-25 DIAGNOSIS — O23591 Infection of other part of genital tract in pregnancy, first trimester: Secondary | ICD-10-CM | POA: Insufficient documentation

## 2022-02-25 DIAGNOSIS — A5901 Trichomonal vulvovaginitis: Secondary | ICD-10-CM | POA: Diagnosis not present

## 2022-02-25 DIAGNOSIS — O26899 Other specified pregnancy related conditions, unspecified trimester: Secondary | ICD-10-CM

## 2022-02-25 DIAGNOSIS — O98311 Other infections with a predominantly sexual mode of transmission complicating pregnancy, first trimester: Secondary | ICD-10-CM | POA: Diagnosis not present

## 2022-02-25 LAB — URINALYSIS, ROUTINE W REFLEX MICROSCOPIC
Bacteria, UA: NONE SEEN
Bilirubin Urine: NEGATIVE
Glucose, UA: NEGATIVE mg/dL
Ketones, ur: NEGATIVE mg/dL
Nitrite: NEGATIVE
Protein, ur: NEGATIVE mg/dL
Specific Gravity, Urine: 1.016 (ref 1.005–1.030)
pH: 6 (ref 5.0–8.0)

## 2022-02-25 LAB — WET PREP, GENITAL
Sperm: NONE SEEN
WBC, Wet Prep HPF POC: >=10 — AB (ref <10)
Yeast Wet Prep HPF POC: NONE SEEN

## 2022-02-25 LAB — POCT PREGNANCY, URINE: Preg Test, Ur: POSITIVE — AB

## 2022-02-25 LAB — HCG, QUANTITATIVE, PREGNANCY: hCG, Beta Chain, Quant, S: 4770 m[IU]/mL — ABNORMAL HIGH (ref <5)

## 2022-02-25 MED ORDER — METRONIDAZOLE 500 MG PO TABS: 500.0000 mg | ORAL_TABLET | Freq: Two times a day (BID) | ORAL | 0 refills | Status: AC

## 2022-02-25 NOTE — MAU Note (Signed)
Kendra Fields is a 32 y.o. at Unknown here in MAU reporting: has irregular periods and been spotting for 7 days.  +HPT.  Reports intermittent cramping, none currently. LMP: 01/23/2022 Onset of complaint: 7 days ago Pain score: 0 Vitals:   02/25/22 1033  BP: 105/69  Pulse: 81  Resp: 19  Temp: 98.8 F (37.1 C)  SpO2: 100%     FHT:NA Lab orders placed from triage:   UPT

## 2022-02-25 NOTE — MAU Provider Note (Signed)
History     CSN: [id number]  Arrival date and time: 02/25/22 1017   Event Date/Time   First Provider Initiated Contact with Patient 02/25/22 1434      Chief Complaint  Patient presents with   Spotting   Possible Pregnancy   HPI Patient is 32 y.o. [id number] [redacted]w[redacted]d here with complaints of spotting and cramping. She is early pregnant. LMP was 12/28. She had a positive UPT at home. Cramping is intermittent, no present currently.   +FM, denies LOF, VB, contractions, vaginal discharge.   OB History     Gravida  6   Para  4   Term  1   Preterm  3   AB  1   Living  3      SAB  0   IAB  0   Ectopic  0   Multiple  1   Live Births  3           Past Medical History:  Diagnosis Date   Chlamydia December 2016   Pregnancy induced hypertension    Preterm labor    Trichomonas vaginitis December 2016    Past Surgical History:  Procedure Laterality Date   NO PAST SURGERIES      Family History  Problem Relation Age of Onset   Alcohol abuse Mother    Asthma Brother    Stroke Maternal Uncle    Cancer Maternal Grandmother        brain   Diabetes Maternal Grandmother    Diabetes Paternal Grandmother    Hypertension Paternal Grandmother     Social History   Tobacco Use   Smoking status: Former    Types: Cigarettes   Smokeless tobacco: Never  Vaping Use   Vaping Use: Never used  Substance Use Topics   Alcohol use: Yes    Comment: occassionally   Drug use: No    Allergies:  Allergies  Allergen Reactions   Penicillins Other (See Comments)    Childhood reaction---unknown    No medications prior to admission.    Review of Systems  Constitutional:  Negative for chills and fever.  HENT:  Negative for congestion and sore throat.   Eyes:  Negative for pain and visual disturbance.  Respiratory:  Negative for cough, chest tightness and shortness of breath.   Cardiovascular:  Negative for chest pain.  Gastrointestinal:  Negative for abdominal pain,  diarrhea, nausea and vomiting.  Endocrine: Negative for cold intolerance and heat intolerance.  Genitourinary:  Positive for vaginal bleeding. Negative for dysuria and flank pain.  Musculoskeletal:  Negative for back pain.  Skin:  Negative for rash.  Allergic/Immunologic: Negative for food allergies.  Neurological:  Negative for dizziness and light-headedness.  Psychiatric/Behavioral:  Negative for agitation.    Physical Exam   Blood pressure 105/69, pulse 81, temperature 98.8 F (37.1 C), temperature source Oral, resp. rate 19, height 5\' 4"  (1.626 m), weight 60.1 kg, last menstrual period 01/23/2022, SpO2 100 %.  Physical Exam Vitals and nursing note reviewed.  Constitutional:      General: She is not in acute distress.    Appearance: She is well-developed.  HENT:     Head: Normocephalic and atraumatic.  Eyes:     General: No scleral icterus.    Conjunctiva/sclera: Conjunctivae normal.  Cardiovascular:     Rate and Rhythm: Normal rate.  Pulmonary:     Effort: Pulmonary effort is normal.  Chest:     Chest wall: No tenderness.  Abdominal:  Palpations: Abdomen is soft.     Tenderness: There is no abdominal tenderness. There is no guarding or rebound.  Genitourinary:    Vagina: Normal.  Musculoskeletal:        General: Normal range of motion.     Cervical back: Normal range of motion and neck supple.  Skin:    General: Skin is warm and dry.     Findings: No rash.  Neurological:     Mental Status: She is alert and oriented to person, place, and time.     MAU Course  Procedures  MDM: moderate  This patient presents to the ED for concern of   Chief Complaint  Patient presents with   Spotting   Possible Pregnancy     These complains involves an extensive number of treatment options, and is a complaint that carries with it a high risk of complications and morbidity.  The differential diagnosis for  1. Cramping in early pregnancy INCLUDES ectopic pregnancy,  non-viable pregnancy/miscarriage and normal variant.  Co morbidities that complicate the patient evaluation: previous twin pregnancy. STDs (Chlamydia, Trich), history of IUFD   External records from outside source obtained and reviewed including CareEverywhere and Prenatal care records  Lab Tests: Wet prep, BHCG, and Gonorrhea/Chlamydia   I ordered, and personally interpreted labs.  The pertinent results include:  Wet prep resulted after patient left. Noted to have trichomonas.   Imaging Studies ordered:  I ordered imaging studies includingTransvaginal [place] I independently visualized and interpreted imaging which showed two gestational sacs I agree with the radiologist interpretation  Medicines ordered and prescription drug management:  Medications: Metronidazole    MAU Course:  Discussed [place] results with inconclusive viability. Discussed possible twin gestation with 2 gestational sacs present.    After the interventions noted above, I reevaluated the patient and found that they have :improved  Dispostion: discharged    Assessment and Plan  1. Spotting affecting pregnancy in first trimester - Discharge patient  2. Cramping affecting pregnancy, antepartum  3. Trichomonas vaginalis (TV) infection  - Repeat bhcg in 48 hr - Repeat US in 1-14 days, made appt at [facility] today - Reviewed return precautions in detail - Wet prep noted to be trich positive after patient left  7:03 PM called patient about trich result. Desires medication sent to [facility] on [address]. Sent in separate encounter  Future Appointments  Date Time Provider [place]  02/28/2022  9:00 AM [facility] FU [facility]  03/18/2022  9:00 AM [facility] US2 [facility] [place] 02/25/2022, 7:03 PM

## 2022-02-26 LAB — MOLECULAR ANCILLARY ONLY
Chlamydia: NEGATIVE
Comment: NEGATIVE
Comment: NORMAL
Neisseria Gonorrhea: NEGATIVE

## 2022-02-28 ENCOUNTER — Ambulatory Visit: Payer: Medicaid Other

## 2022-02-28 DIAGNOSIS — O26851 Spotting complicating pregnancy, first trimester: Secondary | ICD-10-CM

## 2022-02-28 LAB — GC/CHLAMYDIA PROBE AMP
Chlamydia trachomatis, NAA: NEGATIVE
Neisseria Gonorrhoeae by PCR: NEGATIVE

## 2022-02-28 LAB — BETA HCG QUANT (REF LAB): hCG Quant: 8401 m[IU]/mL

## 2022-02-28 NOTE — Progress Notes (Signed)
Beta HCG Follow-up Visit  Kendra Fields presents to [facility] for follow-up beta HCG lab. She was seen in MAU for  vaginal bleeding and abdominal pain  on 02/25/2022. Patient denies  pain or bleeding  today. Discussed with patient that we are following beta HCG levels today. Results will be back in approximately 2 hours. Valid contact number for patient confirmed. I will call the patient with results.   Beta HCG results: 02/25/2022 4,770  02/28/2022       Results and patient history reviewed with first attending, J. Roselie Awkward MD. He will follow up with plan of care with patient.   Martina Sinner 02/28/2022 9:10 AM

## 2022-03-18 ENCOUNTER — Other Ambulatory Visit: Payer: Medicaid Other

## 2022-03-25 ENCOUNTER — Ambulatory Visit (INDEPENDENT_AMBULATORY_CARE_PROVIDER_SITE_OTHER): Payer: Medicaid Other

## 2022-03-25 DIAGNOSIS — Z3A08 8 weeks gestation of pregnancy: Secondary | ICD-10-CM

## 2022-03-25 DIAGNOSIS — O26851 Spotting complicating pregnancy, first trimester: Secondary | ICD-10-CM | POA: Diagnosis not present

## 2022-04-07 ENCOUNTER — Ambulatory Visit (INDEPENDENT_AMBULATORY_CARE_PROVIDER_SITE_OTHER): Payer: Medicaid Other

## 2022-04-07 DIAGNOSIS — Z8759 Personal history of other complications of pregnancy, childbirth and the puerperium: Secondary | ICD-10-CM

## 2022-04-07 DIAGNOSIS — O0991 Supervision of high risk pregnancy, unspecified, first trimester: Secondary | ICD-10-CM

## 2022-04-07 DIAGNOSIS — O09899 Supervision of other high risk pregnancies, unspecified trimester: Secondary | ICD-10-CM

## 2022-04-07 DIAGNOSIS — Z3A10 10 weeks gestation of pregnancy: Secondary | ICD-10-CM

## 2022-04-07 DIAGNOSIS — O0990 Supervision of high risk pregnancy, unspecified, unspecified trimester: Secondary | ICD-10-CM

## 2022-04-07 MED ORDER — BLOOD PRESSURE KIT DEVI: 1.0000 | 0 refills | Status: AC

## 2022-04-07 MED ORDER — VITAFOL ULTRA 29-0.6-0.4-200 MG PO CAPS: 1.0000 | ORAL_CAPSULE | Freq: Every day | ORAL | 11 refills | Status: AC

## 2022-04-07 MED ORDER — GOJJI WEIGHT SCALE MISC: 1.0000 | 0 refills | Status: AC

## 2022-04-07 NOTE — Progress Notes (Signed)
New OB Intake  I connected with Kendra Fields  on 04/07/22 at  9:00 AM EDT by telephone Video Visit and verified that I am speaking with the correct person using two identifiers. Nurse is located at [facility] and pt is located at Home.  I discussed the limitations, risks, security and privacy concerns of performing an evaluation and management service by telephone and the availability of in person appointments. I also discussed with the patient that there may be a patient responsible charge related to this service. The patient expressed understanding and agreed to proceed.  I explained I am completing New OB Intake today. We discussed EDD of 10/30/22 that is based on LMP of 01/24/23. Pt is G7/P1323. I reviewed her allergies, medications, Medical/Surgical/OB history, and appropriate screenings. I informed her of [facility] services. [facility] information placed in AVS. Based on history, this is a high risk pregnancy.  Patient Active Problem List   Diagnosis Date Noted   Trichomonas vaginalis (TV) infection 02/25/2022   History of IUFD 02/09/2019   History of pre-eclampsia 02/01/2019   History of preterm delivery, currently pregnant 02/01/2019    Concerns addressed today  Delivery Plans Plans to deliver at [facility]. Patient given information for [facility] Healthy Baby website for more information about [facility] and [place]. Patient is not interested in water birth. Offered upcoming OB visit with CNM to discuss further.  MyChart/Babyscripts MyChart access verified. I explained pt will have some visits in office and some virtually. Babyscripts instructions given and order placed. Patient verifies receipt of registration text/e-mail. Account successfully created and app downloaded.  Blood Pressure Cuff/Weight Scale Blood pressure cuff ordered for patient to pick-up from [employer]. Explained after first prenatal appt pt will check weekly and document in 37. Patient does not have weight  scale; order sent to [place], patient may track weight weekly in Babyscripts.  Anatomy US Explained first scheduled [place] will be around 19 weeks. Anatomy US TBD.   Labs Discussed [name] genetic screening with patient. Would like both Panorama and Horizon drawn at new OB visit. Routine prenatal labs needed.  COVID Vaccine Patient has not had COVID vaccine.   Is patient a CenteringPregnancy candidate?  Not a Candidate Declined due to Group setting Not a candidate due to  HX of IUFD x 2 If accepted,   Social Determinants of Health Food Insecurity: Patient denies food insecurity. WIC Referral: Patient is not interested in referral to [facility].  Transportation: Patient denies transportation needs. Childcare: Discussed no children allowed at ultrasound appointments. Offered childcare services; patient declines childcare services at this time.  Interested in [place]? If yes, send referral.   First visit review I reviewed new OB appt with patient. I explained they will have a provider visit that includes prenatal labs, pap smear, std screening, genetic screening, and discuss plan of care for pregnancy. Explained pt will be seen by Baltazar Najjar, MD at first visit; encounter routed to appropriate provider. Explained that patient will be seen by pregnancy navigator following visit with provider.   Lucianne Lei, RN 04/07/2022  8:42 AM

## 2022-05-07 ENCOUNTER — Institutional Professional Consult (permissible substitution): Payer: Self-pay | Admitting: Licensed Clinical Social Worker

## 2022-05-07 ENCOUNTER — Encounter: Payer: Medicaid Other | Admitting: Obstetrics

## 2022-06-05 ENCOUNTER — Ambulatory Visit: Payer: Medicaid Other

## 2022-06-05 ENCOUNTER — Ambulatory Visit: Payer: Medicaid Other | Attending: Obstetrics and Gynecology

## 2023-09-22 ENCOUNTER — Other Ambulatory Visit: Payer: Self-pay | Admitting: Family Medicine

## 2023-09-22 DIAGNOSIS — N644 Mastodynia: Secondary | ICD-10-CM

## 2023-09-25 ENCOUNTER — Ambulatory Visit
Admission: RE | Admit: 2023-09-25 | Discharge: 2023-09-25 | Disposition: A | Discharge status: Home / Self Care | Source: Ambulatory Visit | Attending: Family Medicine | Admitting: Family Medicine

## 2023-09-25 DIAGNOSIS — N644 Mastodynia: Secondary | ICD-10-CM

## 2023-09-30 ENCOUNTER — Encounter: Payer: Self-pay | Admitting: Family Medicine

## 2023-10-01 ENCOUNTER — Other Ambulatory Visit: Payer: Self-pay | Admitting: Family Medicine

## 2023-10-01 DIAGNOSIS — N6320 Unspecified lump in the left breast, unspecified quadrant: Secondary | ICD-10-CM

## 2024-04-25 ENCOUNTER — Other Ambulatory Visit
